# Patient Record
Sex: Male | Born: 1945 | Race: Black or African American | Hispanic: No | Marital: Single | Smoking: Current every day smoker
Health system: Southern US, Community
[De-identification: ages and names within clinical notes are randomized; demographics above are authoritative.]

## PROBLEM LIST (undated history)

## (undated) DIAGNOSIS — J45909 Unspecified asthma, uncomplicated: Secondary | ICD-10-CM

## (undated) DIAGNOSIS — D649 Anemia, unspecified: Secondary | ICD-10-CM

## (undated) DIAGNOSIS — J449 Chronic obstructive pulmonary disease, unspecified: Secondary | ICD-10-CM

## (undated) DIAGNOSIS — C801 Malignant (primary) neoplasm, unspecified: Secondary | ICD-10-CM

---

## 2014-02-22 HISTORY — PX: HIP SURGERY: SHX245

## 2014-07-27 ENCOUNTER — Telehealth: Payer: Self-pay | Admitting: Oncology

## 2014-07-27 NOTE — Telephone Encounter (Signed)
S/W PATIENT SISTER(FLORA) AND GAVE NP APPT FOR 09/16 @ 10:30 W/DR. SHADAD.  CALLED DR. MANI SUBRAMANIAN OFFICE AND LEFT MESSAGE FOR MEDICAL RECORDS TO BE FAXED OVER CONTACT INFORMATION WAS GIVEN.

## 2014-08-05 ENCOUNTER — Encounter (HOSPITAL_COMMUNITY): Payer: Self-pay | Admitting: Emergency Medicine

## 2014-08-05 ENCOUNTER — Emergency Department (HOSPITAL_COMMUNITY)

## 2014-08-05 ENCOUNTER — Inpatient Hospital Stay (HOSPITAL_COMMUNITY)
Admission: EM | Admit: 2014-08-05 | Discharge: 2014-08-07 | DRG: 190 | Disposition: A | Discharge status: Hospice | Attending: Internal Medicine | Admitting: Internal Medicine

## 2014-08-05 DIAGNOSIS — Z79899 Other long term (current) drug therapy: Secondary | ICD-10-CM

## 2014-08-05 DIAGNOSIS — Z515 Encounter for palliative care: Secondary | ICD-10-CM | POA: Diagnosis not present

## 2014-08-05 DIAGNOSIS — R5381 Other malaise: Secondary | ICD-10-CM | POA: Diagnosis present

## 2014-08-05 DIAGNOSIS — Z23 Encounter for immunization: Secondary | ICD-10-CM

## 2014-08-05 DIAGNOSIS — J441 Chronic obstructive pulmonary disease with (acute) exacerbation: Principal | ICD-10-CM | POA: Diagnosis present

## 2014-08-05 DIAGNOSIS — J9620 Acute and chronic respiratory failure, unspecified whether with hypoxia or hypercapnia: Secondary | ICD-10-CM | POA: Diagnosis present

## 2014-08-05 DIAGNOSIS — R1310 Dysphagia, unspecified: Secondary | ICD-10-CM | POA: Diagnosis present

## 2014-08-05 DIAGNOSIS — C7951 Secondary malignant neoplasm of bone: Secondary | ICD-10-CM | POA: Diagnosis present

## 2014-08-05 DIAGNOSIS — IMO0002 Reserved for concepts with insufficient information to code with codable children: Secondary | ICD-10-CM | POA: Diagnosis not present

## 2014-08-05 DIAGNOSIS — R627 Adult failure to thrive: Secondary | ICD-10-CM | POA: Diagnosis present

## 2014-08-05 DIAGNOSIS — Z66 Do not resuscitate: Secondary | ICD-10-CM | POA: Diagnosis present

## 2014-08-05 DIAGNOSIS — D649 Anemia, unspecified: Secondary | ICD-10-CM | POA: Diagnosis present

## 2014-08-05 DIAGNOSIS — F17200 Nicotine dependence, unspecified, uncomplicated: Secondary | ICD-10-CM | POA: Diagnosis present

## 2014-08-05 DIAGNOSIS — F0390 Unspecified dementia without behavioral disturbance: Secondary | ICD-10-CM | POA: Diagnosis present

## 2014-08-05 DIAGNOSIS — E43 Unspecified severe protein-calorie malnutrition: Secondary | ICD-10-CM | POA: Diagnosis present

## 2014-08-05 DIAGNOSIS — R0989 Other specified symptoms and signs involving the circulatory and respiratory systems: Secondary | ICD-10-CM | POA: Diagnosis present

## 2014-08-05 DIAGNOSIS — K922 Gastrointestinal hemorrhage, unspecified: Secondary | ICD-10-CM | POA: Diagnosis present

## 2014-08-05 DIAGNOSIS — G8929 Other chronic pain: Secondary | ICD-10-CM | POA: Diagnosis present

## 2014-08-05 DIAGNOSIS — C61 Malignant neoplasm of prostate: Secondary | ICD-10-CM | POA: Diagnosis present

## 2014-08-05 DIAGNOSIS — C7952 Secondary malignant neoplasm of bone marrow: Secondary | ICD-10-CM

## 2014-08-05 DIAGNOSIS — R531 Weakness: Secondary | ICD-10-CM

## 2014-08-05 DIAGNOSIS — R0603 Acute respiratory distress: Secondary | ICD-10-CM | POA: Diagnosis present

## 2014-08-05 DIAGNOSIS — D62 Acute posthemorrhagic anemia: Secondary | ICD-10-CM | POA: Diagnosis present

## 2014-08-05 DIAGNOSIS — Z9981 Dependence on supplemental oxygen: Secondary | ICD-10-CM

## 2014-08-05 DIAGNOSIS — R0609 Other forms of dyspnea: Secondary | ICD-10-CM | POA: Diagnosis present

## 2014-08-05 DIAGNOSIS — F09 Unspecified mental disorder due to known physiological condition: Secondary | ICD-10-CM | POA: Diagnosis present

## 2014-08-05 HISTORY — DX: Chronic obstructive pulmonary disease, unspecified: J44.9

## 2014-08-05 HISTORY — DX: Unspecified asthma, uncomplicated: J45.909

## 2014-08-05 HISTORY — DX: Malignant (primary) neoplasm, unspecified: C80.1

## 2014-08-05 LAB — TROPONIN I

## 2014-08-05 LAB — URINALYSIS, ROUTINE W REFLEX MICROSCOPIC
GLUCOSE, UA: NEGATIVE mg/dL
Ketones, ur: NEGATIVE mg/dL
Nitrite: NEGATIVE
Protein, ur: 30 mg/dL — AB
SPECIFIC GRAVITY, URINE: 1.025 (ref 1.005–1.030)
Urobilinogen, UA: 1 mg/dL (ref 0.0–1.0)
pH: 5 (ref 5.0–8.0)

## 2014-08-05 LAB — PREPARE RBC (CROSSMATCH)

## 2014-08-05 LAB — URINE MICROSCOPIC-ADD ON

## 2014-08-05 LAB — COMPREHENSIVE METABOLIC PANEL
ALT: 8 U/L (ref 0–53)
AST: 10 U/L (ref 0–37)
Albumin: 2 g/dL — ABNORMAL LOW (ref 3.5–5.2)
Alkaline Phosphatase: 542 U/L — ABNORMAL HIGH (ref 39–117)
Anion gap: 17 — ABNORMAL HIGH (ref 5–15)
BILIRUBIN TOTAL: 0.2 mg/dL — AB (ref 0.3–1.2)
BUN: 22 mg/dL (ref 6–23)
CHLORIDE: 97 meq/L (ref 96–112)
CO2: 23 mEq/L (ref 19–32)
CREATININE: 0.38 mg/dL — AB (ref 0.50–1.35)
Calcium: 7.7 mg/dL — ABNORMAL LOW (ref 8.4–10.5)
GFR calc Af Amer: >90 mL/min (ref >90)
GFR calc non Af Amer: >90 mL/min (ref >90)
Glucose, Bld: 214 mg/dL — ABNORMAL HIGH (ref 70–99)
Potassium: 3.6 mEq/L — ABNORMAL LOW (ref 3.7–5.3)
Sodium: 137 mEq/L (ref 137–147)
TOTAL PROTEIN: 7.2 g/dL (ref 6.0–8.3)

## 2014-08-05 LAB — I-STAT ARTERIAL BLOOD GAS, ED
ACID-BASE EXCESS: 2 mmol/L (ref 0.0–2.0)
Bicarbonate: 27.2 mEq/L — ABNORMAL HIGH (ref 20.0–24.0)
O2 Saturation: 99 %
PO2 ART: 143 mmHg — AB (ref 80.0–100.0)
Patient temperature: 98.6
TCO2: 29 mmol/L (ref 0–100)
pCO2 arterial: 45.4 mmHg — ABNORMAL HIGH (ref 35.0–45.0)
pH, Arterial: 7.386 (ref 7.350–7.450)

## 2014-08-05 LAB — POC OCCULT BLOOD, ED: Fecal Occult Bld: POSITIVE — AB

## 2014-08-05 LAB — CBC
HCT: 23 % — ABNORMAL LOW (ref 39.0–52.0)
Hemoglobin: 7.1 g/dL — ABNORMAL LOW (ref 13.0–17.0)
MCH: 26.6 pg (ref 26.0–34.0)
MCHC: 30.9 g/dL (ref 30.0–36.0)
MCV: 86.1 fL (ref 78.0–100.0)
Platelets: 151 10*3/uL (ref 150–400)
RBC: 2.67 MIL/uL — ABNORMAL LOW (ref 4.22–5.81)
RDW: 22.1 % — AB (ref 11.5–15.5)
WBC: 2.9 10*3/uL — ABNORMAL LOW (ref 4.0–10.5)

## 2014-08-05 LAB — PRO B NATRIURETIC PEPTIDE: Pro B Natriuretic peptide (BNP): 878.3 pg/mL — ABNORMAL HIGH (ref 0–125)

## 2014-08-05 LAB — MRSA PCR SCREENING: MRSA by PCR: NEGATIVE

## 2014-08-05 LAB — ABO/RH: ABO/RH(D): O POS

## 2014-08-05 MED ORDER — HYDROMORPHONE HCL PF 1 MG/ML IJ SOLN
1.0000 mg | Freq: Once | INTRAMUSCULAR | Status: AC
  Administered 2014-08-05: 1 mg via INTRAVENOUS
  Filled 2014-08-05: qty 1

## 2014-08-05 MED ORDER — VITAMIN D3 25 MCG (1000 UNIT) PO TABS
1000.0000 [IU] | ORAL_TABLET | Freq: Every day | ORAL | Status: DC
  Administered 2014-08-05 – 2014-08-07 (×3): 1000 [IU] via ORAL
  Filled 2014-08-05 (×3): qty 1

## 2014-08-05 MED ORDER — IPRATROPIUM BROMIDE 0.02 % IN SOLN
0.5000 mg | Freq: Four times a day (QID) | RESPIRATORY_TRACT | Status: DC
  Administered 2014-08-05 – 2014-08-06 (×2): 0.5 mg via RESPIRATORY_TRACT
  Filled 2014-08-05 (×2): qty 2.5

## 2014-08-05 MED ORDER — ONDANSETRON HCL 4 MG/2ML IJ SOLN: 4.0000 mg | Freq: Four times a day (QID) | INTRAMUSCULAR | Status: DC | PRN

## 2014-08-05 MED ORDER — METHADONE HCL 5 MG PO TABS
15.0000 mg | ORAL_TABLET | Freq: Three times a day (TID) | ORAL | Status: DC
  Administered 2014-08-05 – 2014-08-07 (×6): 15 mg via ORAL
  Filled 2014-08-05: qty 1
  Filled 2014-08-05: qty 2
  Filled 2014-08-05 (×2): qty 1
  Filled 2014-08-05: qty 2
  Filled 2014-08-05 (×5): qty 1

## 2014-08-05 MED ORDER — ALPRAZOLAM 0.5 MG PO TABS
0.5000 mg | ORAL_TABLET | Freq: Three times a day (TID) | ORAL | Status: DC
  Administered 2014-08-05 – 2014-08-07 (×6): 0.5 mg via ORAL
  Filled 2014-08-05 (×6): qty 1

## 2014-08-05 MED ORDER — FUROSEMIDE 10 MG/ML IJ SOLN
40.0000 mg | Freq: Once | INTRAMUSCULAR | Status: AC
  Administered 2014-08-05: 40 mg via INTRAVENOUS
  Filled 2014-08-05: qty 4

## 2014-08-05 MED ORDER — ALBUTEROL SULFATE (2.5 MG/3ML) 0.083% IN NEBU
5.0000 mg | INHALATION_SOLUTION | Freq: Once | RESPIRATORY_TRACT | Status: AC
  Administered 2014-08-05: 5 mg via RESPIRATORY_TRACT
  Filled 2014-08-05: qty 6

## 2014-08-05 MED ORDER — SODIUM CHLORIDE 0.9 % IV SOLN: 10.0000 mL/h | Freq: Once | INTRAVENOUS | Status: DC

## 2014-08-05 MED ORDER — PANTOPRAZOLE SODIUM 40 MG IV SOLR
40.0000 mg | Freq: Two times a day (BID) | INTRAVENOUS | Status: DC
Start: 2014-08-05 — End: 2014-08-07
  Administered 2014-08-05 – 2014-08-07 (×4): 40 mg via INTRAVENOUS
  Filled 2014-08-05 (×6): qty 40

## 2014-08-05 MED ORDER — MORPHINE SULFATE 15 MG PO TABS: 15.0000 mg | ORAL_TABLET | ORAL | Status: DC | PRN

## 2014-08-05 MED ORDER — ALBUTEROL SULFATE (2.5 MG/3ML) 0.083% IN NEBU: 2.5000 mg | INHALATION_SOLUTION | RESPIRATORY_TRACT | Status: DC | PRN

## 2014-08-05 MED ORDER — LEVOFLOXACIN IN D5W 500 MG/100ML IV SOLN
500.0000 mg | INTRAVENOUS | Status: DC
  Administered 2014-08-05 – 2014-08-06 (×2): 500 mg via INTRAVENOUS
  Filled 2014-08-05 (×4): qty 100

## 2014-08-05 MED ORDER — METHYLPREDNISOLONE SODIUM SUCC 125 MG IJ SOLR
60.0000 mg | Freq: Two times a day (BID) | INTRAMUSCULAR | Status: DC
  Administered 2014-08-05 – 2014-08-06 (×2): 60 mg via INTRAVENOUS
  Filled 2014-08-05 (×4): qty 0.96
  Filled 2014-08-05: qty 2

## 2014-08-05 MED ORDER — SENNOSIDES-DOCUSATE SODIUM 8.6-50 MG PO TABS
1.0000 | ORAL_TABLET | Freq: Two times a day (BID) | ORAL | Status: DC
  Administered 2014-08-05 – 2014-08-07 (×4): 1 via ORAL
  Filled 2014-08-05 (×5): qty 1

## 2014-08-05 MED ORDER — ACETAMINOPHEN 650 MG RE SUPP: 650.0000 mg | Freq: Four times a day (QID) | RECTAL | Status: DC | PRN

## 2014-08-05 MED ORDER — ONDANSETRON HCL 4 MG PO TABS: 4.0000 mg | ORAL_TABLET | Freq: Four times a day (QID) | ORAL | Status: DC | PRN

## 2014-08-05 MED ORDER — ALBUTEROL SULFATE (2.5 MG/3ML) 0.083% IN NEBU
2.5000 mg | INHALATION_SOLUTION | Freq: Four times a day (QID) | RESPIRATORY_TRACT | Status: DC
  Administered 2014-08-05 – 2014-08-06 (×2): 2.5 mg via RESPIRATORY_TRACT
  Filled 2014-08-05 (×2): qty 3

## 2014-08-05 MED ORDER — IPRATROPIUM BROMIDE 0.02 % IN SOLN
0.5000 mg | Freq: Once | RESPIRATORY_TRACT | Status: AC
  Administered 2014-08-05: 0.5 mg via RESPIRATORY_TRACT
  Filled 2014-08-05: qty 2.5

## 2014-08-05 MED ORDER — ACETAMINOPHEN 325 MG PO TABS: 650.0000 mg | ORAL_TABLET | Freq: Four times a day (QID) | ORAL | Status: DC | PRN

## 2014-08-05 NOTE — H&P (Addendum)
[facility] History and Physical  Gregory [name] YSA:[id number] DOB: [birth year] DOA: 08/05/2014  Referring physician: EDP PCP: No PCP Per Patient   Chief Complaint: shortness of breath, weakness, failure to thrive  HPI: Gregory [name] is a 68 y.o. 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  No reports of EGD or colonoscopy performed then, but sister reports this being done a few years prior. He was under the care of an oncologist Dr.Mani Gwenevere Ghazi in [place], has received different Chemo Tx in 2014, but none thus year due to low blood counts and poor overall health. He has recently been under Hospice care in [place] and just moved to [place] 1 month back to be with his extended family and now stays with his sister who provides most of the history. Patient is a poor historian and has cognitive dysfunction, which his sister has noticed since he moved in with him Since Monday he has had increasing dyspnea, cough, wheezing, still smokes 1PPD, hence brought to the ER In ER, noted to have Resp distress, anemia with hb of 7, heme positive stool   Review of Systems: positives bolded Constitutional:  No weight loss, night sweats, Fevers, chills, fatigue.  HEENT:  No headaches, Difficulty swallowing,Tooth/dental problems,Sore throat,  No sneezing, itching, ear ache, nasal congestion, post nasal drip,  Cardio-vascular:  chest pain, Orthopnea, PND, swelling in lower extremities, anasarca, dizziness, palpitations  GI:  No heartburn, indigestion, abdominal pain, nausea, vomiting, diarrhea, change in bowel habits, loss of appetite  Resp:  shortness of breath with exertion or at rest. No excess mucus, no productive cough, No non-productive cough, No coughing up of  blood.No change in color of mucus.No wheezing.No chest wall deformity  Skin:  no rash or lesions.  GU:  no dysuria, change in color of urine, no urgency or frequency. No flank pain.  Musculoskeletal:  No joint pain or swelling. No decreased range of motion. No back pain.  Psych:  No change in mood or affect. No depression or anxiety. No memory loss.   Past Medical History  Diagnosis Date  . COPD (chronic obstructive pulmonary disease)   . Asthma   . Cancer     prostate with mets   History reviewed. No pertinent past surgical history. Social History:  reports that he has quit smoking. His smoking use included Cigarettes. He smoked 0.00 packs per day. He does not have any smokeless tobacco history on file. He reports that he does not drink alcohol or use illicit drugs.  No Known Allergies  No family history on file.   Prior to Admission medications   Medication Sig Start Date End Date Taking? Authorizing Provider  ALPRAZolam Duanne Moron) 0.5 MG tablet Take 0.5 mg by mouth 3 (three) times daily.   Yes Historical Provider, MD  cholecalciferol (VITAMIN D) 1000 UNITS tablet Take 1,000 Units by mouth daily.   Yes Historical Provider, MD  dexamethasone (DECADRON) 4 MG tablet Take 4 mg by mouth 2 (two) times daily with a meal.   Yes Historical Provider, MD  hydrochlorothiazide (HYDRODIURIL) 50 MG tablet Take 50 mg by mouth daily.   Yes Historical Provider, MD  methadone (DOLOPHINE) 10 MG tablet Take 15 mg by mouth every 8 (eight) hours.   Yes Historical Provider, MD  morphine (MSIR) 15 MG tablet Take 15 mg by mouth every 3 (three) hours as needed for severe pain.   Yes Historical Provider, MD  pantoprazole (PROTONIX) 40 MG tablet Take 40 mg by mouth 2 (two) times daily.   Yes Historical Provider, MD  predniSONE (DELTASONE) 20 MG tablet Take 10-60 mg by mouth 2 (two) times daily with a meal.    Yes Historical Provider, MD  senna-docusate (SENOKOT-S) 8.6-50 MG per tablet Take 1 tablet by mouth 2  (two) times daily.   Yes Historical Provider, MD   Physical Exam: Filed Vitals:   08/05/14 1315 08/05/14 1330 08/05/14 1346 08/05/14 1433  BP: 163/85 149/62    Pulse: 127 135    Temp:      Resp:      SpO2: 100% 100% 100% 100%    Wt Readings from Last 3 Encounters:  No data found for Wt    General:  Awake, alert, cachectic/emaciated elderly male, wearing BIPAP Eyes: PERRL, normal lids, irises & conjunctiva ENT: oral mucosa dry, grossly normal lips & tongue Neck: no LAD, masses or thyromegaly Cardiovascular: RRR, no m/r/g. No LE edema. Respiratory: scattered wheezes and  ronchi, poor air movement. Abdomen: soft, Nt, ND, BS present Skin: no rash or induration seen on limited exam Musculoskeletal: grossly normal tone BUE/BLE, 2plus ankle edema Psychiatric: unable to assess, on BIPAP Neurologic: grossly non-focal.          Labs on Admission:  Basic Metabolic Panel:  Recent Labs Lab 08/05/14 1239  NA 137  K 3.6*  CL 97  CO2 23  GLUCOSE 214*  BUN 22  CREATININE 0.38*  CALCIUM 7.7*   Liver Function Tests:  Recent Labs Lab 08/05/14 1239  AST 10  ALT 8  ALKPHOS 542*  BILITOT 0.2*  PROT 7.2  ALBUMIN 2.0*   No results found for this basename: LIPASE, AMYLASE,  in the last 168 hours No results found for this basename: AMMONIA,  in the last 168 hours CBC:  Recent Labs Lab 08/05/14 1239  WBC 2.9*  HGB 7.1*  HCT 23.0*  MCV 86.1  PLT 151   Cardiac Enzymes:  Recent Labs Lab 08/05/14 1239  TROPONINI <0.30    BNP (last 3 results)  Recent Labs  08/05/14 1239  PROBNP 878.3*   CBG: No results found for this basename: GLUCAP,  in the last 168 hours  Radiological Exams on Admission: Dg Chest Port 1 View  08/05/2014   CLINICAL DATA:  Shortness of breath.  Trauma.  EXAM: PORTABLE CHEST - 1 VIEW  COMPARISON:  None  FINDINGS: The bones are diffusely sclerotic consistent with metastatic prostate cancer. Port-A-Cath in place. Heart size and vascularity are  normal. The interstitial markings are diffusely slightly accentuated. No effusions or infiltrates.  IMPRESSION: Diffuse sclerotic metastatic disease. Accentuated interstitial markings, nonspecific, without consolidative infiltrates. No prior studies available for comparison.   Electronically Signed   By: Rozetta Nunnery M.D.   On: 08/05/2014 13:15    Assessment/Plan Principal Problem:  Acute Hypoxic resp failure -wean off BIPAP -ABG now -due to COPD with exacerbation -has chronic resp failure uses 3L home O2 -duonebs, IV solumedrol, levaquin  Stage 4 prostate CA/extensive bony mets -previously been Treated with Chemo but recently on Hospice in [place] -had prophylactic Im nail placed in June -scheduled to Fu with Dr.Shadad at [facility] on 9/16 -family understands poor prognoses and if agreeable to consider comfort care if he doesn't improve   GI bleed -intermittent dark stools per sister  and was hospitalized in [place] for same, was given 5units PRBc in July -sister recalls Colonoscopy/endo in 2011 or 12, based on some records at home -IV PPI q12, clears -supportive care for now -if doesn't improve may need to consider further evaluation    Anemia -due to chronic disease/stage 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/ and Gi blood loss -transfuse 2units PRBC -CBC in am   Cognitive dysfunction/early dementia  -noted by sister for >80mth -check TSH,b12   COPD/chronic resp failure -on 3L home O2    Chronic pain -due to extensive bony mets -continue methadone and MSIR   Severe malnutrition -Rd consult, check prealbumin   Asymptomatic pyuria -monitor, no indication for Abx at this time  Code Status: DNR after discussion with sister and daughters DVT Prophylaxis: SCDs Family Communication: d/w sister and daughetrs at bedside Disposition Plan: admit to SDU  Time spent: 12min  Dietrich Ke [facility] Pager [phone]  **Disclaimer: This note may have been dictated with voice recognition software. Similar sounding  words can inadvertently be transcribed and this note may contain transcription errors which may not have been corrected upon publication of note.**

## 2014-08-05 NOTE — ED Notes (Signed)
Family at bedside now states that pt moved here from [place] 1 month prior to seek better health care for terminal prostate CA.  Pt had been improving with increasing appetite.  Sister states that pt has a decreased appetite and appears to have lost 10 lbs in last 3 days.  She also states yesterday he began "talking out of his head".  Pt is with hospice but is wanting to be released in order to seek medical care for his CA.

## 2014-08-05 NOTE — Progress Notes (Signed)
Pt arrived from ED-VSS-started first unit of blood-oriented patient to unit and routine, Call bell within reach. Will continue to monitor. CMT and elink notified of arrival

## 2014-08-05 NOTE — ED Provider Notes (Signed)
CSN: [id number]     Arrival date & time 08/05/14  1152 History   First MD Initiated Contact with Patient 08/05/14 1157     Chief Complaint  Patient presents with  . Shortness of Breath     (Consider location/radiation/quality/duration/timing/severity/associated sxs/prior Treatment) Patient is a 68 y.o. male presenting with shortness of breath. The history is provided by the patient, a relative and the EMS personnel. The history is limited by the condition of the patient.  Shortness of Breath pt w hx copd, smoker, hx metastatic prostate ca, home hospice, presents w generalized weakness, decreased po intake, decreased responsiveness and increased wob/wheezing in the past few days.   Pt moved from [place], [place] to live here w family approximately 1 month ago.  Pt in severe resp distress, tachycardic, on bipap on arrival - level 5 caveat.      Past Medical History  Diagnosis Date  . COPD (chronic obstructive pulmonary disease)   . Asthma   . Cancer     prostate with mets   History reviewed. No pertinent past surgical history. No family history on file. History  Substance Use Topics  . Smoking status: Former Smoker    Types: Cigarettes  . Smokeless tobacco: Not on file  . Alcohol Use: No    Review of Systems  Unable to perform ROS: Severe respiratory distress  Respiratory: Positive for shortness of breath.   level 5 caveat      Allergies  Review of patient's allergies indicates no known allergies.  Home Medications   Prior to Admission medications   Medication Sig Start Date End Date Taking? Authorizing Provider  ALPRAZolam Duanne Moron) 0.5 MG tablet Take 0.5 mg by mouth 3 (three) times daily.   Yes Historical Provider, MD  cholecalciferol (VITAMIN D) 1000 UNITS tablet Take 1,000 Units by mouth daily.   Yes Historical Provider, MD  dexamethasone (DECADRON) 4 MG tablet Take 4 mg by mouth 2 (two) times daily with a meal.   Yes Historical Provider, MD  hydrochlorothiazide  (HYDRODIURIL) 50 MG tablet Take 50 mg by mouth daily.   Yes Historical Provider, MD  methadone (DOLOPHINE) 10 MG tablet Take 15 mg by mouth every 8 (eight) hours.   Yes Historical Provider, MD  morphine (MSIR) 15 MG tablet Take 15 mg by mouth every 3 (three) hours as needed for severe pain.   Yes Historical Provider, MD  pantoprazole (PROTONIX) 40 MG tablet Take 40 mg by mouth 2 (two) times daily.   Yes Historical Provider, MD  predniSONE (DELTASONE) 20 MG tablet Take 10-60 mg by mouth 2 (two) times daily with a meal.    Yes Historical Provider, MD  senna-docusate (SENOKOT-S) 8.6-50 MG per tablet Take 1 tablet by mouth 2 (two) times daily.   Yes Historical Provider, MD   There were no vitals taken for this visit. Physical Exam  Nursing note and vitals reviewed. Constitutional: He appears well-developed and well-nourished.  Very thin appearing, tachycardic, tachypneic, resp distress  HENT:  Head: Atraumatic.  Mouth/Throat: Oropharynx is clear and moist.  Eyes: Conjunctivae are normal. Pupils are equal, round, and reactive to light. No scleral icterus.  Neck: Neck supple. No tracheal deviation present.  Cardiovascular: Regular rhythm, normal heart sounds and intact distal pulses.   tachycardic  Pulmonary/Chest: No accessory muscle usage. No respiratory distress. He has wheezes.  Resp distress, wheezing.   Abdominal: Soft. Bowel sounds are normal. He exhibits no distension and no mass. There is no tenderness. There is no rebound and  no guarding.  Genitourinary:  Very dark brown, almost black, stool, heme positive.   Musculoskeletal: Normal range of motion. He exhibits edema. He exhibits no tenderness.  Symmetric bilateral foot, ankle, and lower leg edema.   Neurological: He is alert.  Alert, oriented to person/place. Moves bil ext purposefully w good strength. sens intact.   Skin: Skin is warm and dry. No rash noted.  Psychiatric:  Alert, resp distress.     ED Course  Procedures  (including critical care time) Labs Review  Results for orders placed during the hospital encounter of 08/05/14  PRO B NATRIURETIC PEPTIDE      Result Value Ref Range   Pro B Natriuretic peptide (BNP) 878.3 (*) 0 - 125 pg/mL  CBC      Result Value Ref Range   WBC 2.9 (*) 4.0 - 10.5 K/uL   RBC 2.67 (*) 4.22 - 5.81 MIL/uL   Hemoglobin 7.1 (*) 13.0 - 17.0 g/dL   HCT 23.0 (*) 39.0 - 52.0 %   MCV 86.1  78.0 - 100.0 fL   MCH 26.6  26.0 - 34.0 pg   MCHC 30.9  30.0 - 36.0 g/dL   RDW 22.1 (*) 11.5 - 15.5 %   Platelets 151  150 - 400 K/uL  COMPREHENSIVE METABOLIC PANEL      Result Value Ref Range   Sodium 137  137 - 147 mEq/L   Potassium 3.6 (*) 3.7 - 5.3 mEq/L   Chloride 97  96 - 112 mEq/L   CO2 23  19 - 32 mEq/L   Glucose, Bld 214 (*) 70 - 99 mg/dL   BUN 22  6 - 23 mg/dL   Creatinine, Ser 0.38 (*) 0.50 - 1.35 mg/dL   Calcium 7.7 (*) 8.4 - 10.5 mg/dL   Total Protein 7.2  6.0 - 8.3 g/dL   Albumin 2.0 (*) 3.5 - 5.2 g/dL   AST 10  0 - 37 U/L   ALT 8  0 - 53 U/L   Alkaline Phosphatase 542 (*) 39 - 117 U/L   Total Bilirubin 0.2 (*) 0.3 - 1.2 mg/dL   GFR calc non Af Amer >90  >90 mL/min   GFR calc Af Amer >90  >90 mL/min   Anion gap 17 (*) 5 - 15  TROPONIN I      Result Value Ref Range   Troponin I <0.30  <0.30 ng/mL  URINALYSIS, ROUTINE W REFLEX MICROSCOPIC      Result Value Ref Range   Color, Urine AMBER (*) YELLOW   APPearance CLOUDY (*) CLEAR   Specific Gravity, Urine 1.025  1.005 - 1.030   pH 5.0  5.0 - 8.0   Glucose, UA NEGATIVE  NEGATIVE mg/dL   Hgb urine dipstick MODERATE (*) NEGATIVE   Bilirubin Urine SMALL (*) NEGATIVE   Ketones, ur NEGATIVE  NEGATIVE mg/dL   Protein, ur 30 (*) NEGATIVE mg/dL   Urobilinogen, UA 1.0  0.0 - 1.0 mg/dL   Nitrite NEGATIVE  NEGATIVE   Leukocytes, UA MODERATE (*) NEGATIVE  URINE MICROSCOPIC-ADD ON      Result Value Ref Range   Squamous Epithelial / LPF RARE  RARE   WBC, UA 11-20  <3 WBC/hpf   RBC / HPF 0-2  <3 RBC/hpf   Bacteria, UA  MANY (*) RARE   Urine-Other MUCOUS PRESENT    I-STAT ARTERIAL BLOOD GAS, ED      Result Value Ref Range   pH, Arterial 7.386  7.350 - 7.450  pCO2 arterial 45.4 (*) 35.0 - 45.0 mmHg   pO2, Arterial 143.0 (*) 80.0 - 100.0 mmHg   Bicarbonate 27.2 (*) 20.0 - 24.0 mEq/L   TCO2 29  0 - 100 mmol/L   O2 Saturation 99.0     Acid-Base Excess 2.0  0.0 - 2.0 mmol/L   Patient temperature 98.6 F     Collection site RADIAL, ALLEN'S TEST ACCEPTABLE     Drawn by Operator     Sample type ARTERIAL    POC OCCULT BLOOD, ED      Result Value Ref Range   Fecal Occult Bld POSITIVE (*) NEGATIVE   Dg Chest Port 1 View  08/05/2014   CLINICAL DATA:  Shortness of breath.  Trauma.  EXAM: PORTABLE CHEST - 1 VIEW  COMPARISON:  None  FINDINGS: The bones are diffusely sclerotic consistent with metastatic prostate cancer. Port-A-Cath in place. Heart size and vascularity are normal. The interstitial markings are diffusely slightly accentuated. No effusions or infiltrates.  IMPRESSION: Diffuse sclerotic metastatic disease. Accentuated interstitial markings, nonspecific, without consolidative infiltrates. No prior studies available for comparison.   Electronically Signed   By: Rozetta Nunnery M.D.   On: 08/05/2014 13:15       EKG Interpretation   Date/Time:  Saturday August 05 2014 11:59:57 EDT Ventricular Rate:  132 PR Interval:    QRS Duration: 63 QT Interval:  303 QTC Calculation: 449 R Axis:   68 Text Interpretation:  Sinus tachycardia Nonspecific ST abnormality  Baseline wander No previous tracing Confirmed by Ashok Cordia  MD, Norwood Quezada ([id number])  on 08/05/2014 12:08:41 PM      MDM  Iv ns. O2. Continuous pulse ox and monitor.  Bipap. Albuterol and atrovent neb.  hgb low.   Patient family/sister arrives. Sister states while in [place] had several transfusions, and at one point hgb 6 - family unsure of specifics, whether from gi bleeding, etc.  She states his stools have been dark/black.  Stool heme pos.  Transfuse  prbcs. States hx copd, uses home o2, also continues to smoke. Also notes hx metastatic prostate ca, hospice care.   Family states compliant w his normal meds, no recent change in meds. No report of fevers at home. No trauma/fall. No specific c/o of pain or other specific symptoms.   Albuterol and atrovent neb tx (has already recd from ems as well as solumedrol iv - pt noted to be on chronic, tapering dose pred at home).  Additional albuterol and atrovent nebs.   Slowly able to wean from bipap.  Stat transfusion 2 units prbc.  Med service called to admit - will admit to stepdown.  CRITICAL CARE  RE copd exacerbation/resp distress requiring bipap, acute gi bleeding w severe anemia, weakness, metastatic cancer.  Performed by: Mirna Mires Total critical care time: 45 Critical care time was exclusive of separately billable procedures and treating other patients. Critical care was necessary to treat or prevent imminent or life-threatening deterioration. Critical care was time spent personally by me on the following activities: development of treatment plan with patient and/or surrogate as well as nursing, discussions with consultants, evaluation of patient's response to treatment, examination of patient, obtaining history from patient or surrogate, ordering and performing treatments and interventions, ordering and review of laboratory studies, ordering and review of radiographic studies, pulse oximetry and re-evaluation of patient's condition.       Mirna Mires, MD 08/05/14 1455

## 2014-08-05 NOTE — Progress Notes (Signed)
Chaplain returned page from nurse about living will. Nurse introduced chaplain to family. Chaplain explained medical POA and living will. Chaplain fielded questions. Family expressed concern about who would make decisions. Chaplain clarified the difference between a living will and a "financial will" stating that "the living will is concerned with the patient's medical wishes should he not be able to make decisions for himself." Chaplain explained that family need not be present for the Advance Directive to be complete. Chaplain demonstrated hospitality by walking family to the exit. Chaplain will refer this case to the unit chaplain during the week.  08/05/14 1800  Clinical Encounter Type  Visited With Family  Visit Type Initial  Referral From Nurse  Stress Factors  Family Stress Factors Health changes;Family relationships;Financial concerns  Regulatory affairs officer (For Healthcare)  Does patient have an advance directive? (Family was interested in Advanced Directive and Living Will)  Gregory [name], Gregory [name] 6:17 PM 08/05/2014

## 2014-08-05 NOTE — ED Notes (Addendum)
Pt from home via EMS for sob.  Hx of copd.  Tripod breathing.  Pt given a total of 15 albuterol and 1 atrovent and 125 solumedrol.  Placed on C-pap.  AO x 4.  Pt is on hospice for prostate CA.  Initially wheezing throughout, but pt has responded to brthng tx with wheezes only to L side.  Denies chest pain.  Ambulatory with walker on scene.

## 2014-08-06 DIAGNOSIS — R0609 Other forms of dyspnea: Secondary | ICD-10-CM

## 2014-08-06 DIAGNOSIS — R0989 Other specified symptoms and signs involving the circulatory and respiratory systems: Secondary | ICD-10-CM

## 2014-08-06 LAB — PREALBUMIN: PREALBUMIN: 7.7 mg/dL — AB (ref 17.0–34.0)

## 2014-08-06 LAB — BASIC METABOLIC PANEL
ANION GAP: 13 (ref 5–15)
BUN: 21 mg/dL (ref 6–23)
CO2: 26 mEq/L (ref 19–32)
CREATININE: 0.43 mg/dL — AB (ref 0.50–1.35)
Calcium: 6.6 mg/dL — ABNORMAL LOW (ref 8.4–10.5)
Chloride: 98 mEq/L (ref 96–112)
Glucose, Bld: 134 mg/dL — ABNORMAL HIGH (ref 70–99)
POTASSIUM: 3.5 meq/L — AB (ref 3.7–5.3)
Sodium: 137 mEq/L (ref 137–147)

## 2014-08-06 LAB — CBC
HCT: 24.1 % — ABNORMAL LOW (ref 39.0–52.0)
Hemoglobin: 7.8 g/dL — ABNORMAL LOW (ref 13.0–17.0)
MCH: 27.3 pg (ref 26.0–34.0)
MCHC: 32.4 g/dL (ref 30.0–36.0)
MCV: 84.3 fL (ref 78.0–100.0)
PLATELETS: 109 10*3/uL — AB (ref 150–400)
RBC: 2.86 MIL/uL — ABNORMAL LOW (ref 4.22–5.81)
RDW: 19.3 % — AB (ref 11.5–15.5)
WBC: 3.3 10*3/uL — ABNORMAL LOW (ref 4.0–10.5)

## 2014-08-06 LAB — IRON AND TIBC
IRON: 55 ug/dL (ref 42–135)
Saturation Ratios: 38 % (ref 20–55)
TIBC: 146 ug/dL — ABNORMAL LOW (ref 215–435)
UIBC: 91 ug/dL — ABNORMAL LOW (ref 125–400)

## 2014-08-06 LAB — TYPE AND SCREEN
ABO/RH(D): O POS
Antibody Screen: NEGATIVE
Unit division: 0
Unit division: 0

## 2014-08-06 LAB — RETICULOCYTES
RBC.: 2.86 MIL/uL — AB (ref 4.22–5.81)
RETIC CT PCT: 0.6 % (ref 0.4–3.1)
Retic Count, Absolute: 17.2 10*3/uL — ABNORMAL LOW (ref 19.0–186.0)

## 2014-08-06 LAB — FERRITIN: FERRITIN: 5003 ng/mL — AB (ref 22–322)

## 2014-08-06 LAB — VITAMIN B12: VITAMIN B 12: 306 pg/mL (ref 211–911)

## 2014-08-06 LAB — FOLATE: Folate: 8.2 ng/mL

## 2014-08-06 MED ORDER — CHLORHEXIDINE GLUCONATE 0.12 % MT SOLN
15.0000 mL | Freq: Two times a day (BID) | OROMUCOSAL | Status: DC
  Administered 2014-08-06 – 2014-08-07 (×2): 15 mL via OROMUCOSAL
  Filled 2014-08-06 (×5): qty 15

## 2014-08-06 MED ORDER — IPRATROPIUM-ALBUTEROL 0.5-2.5 (3) MG/3ML IN SOLN
3.0000 mL | Freq: Four times a day (QID) | RESPIRATORY_TRACT | Status: DC
  Administered 2014-08-06: 3 mL via RESPIRATORY_TRACT

## 2014-08-06 MED ORDER — METHYLPREDNISOLONE SODIUM SUCC 40 MG IJ SOLR
40.0000 mg | Freq: Two times a day (BID) | INTRAMUSCULAR | Status: DC
  Administered 2014-08-06 – 2014-08-07 (×2): 40 mg via INTRAVENOUS
  Filled 2014-08-06 (×4): qty 1

## 2014-08-06 MED ORDER — PNEUMOCOCCAL VAC POLYVALENT 25 MCG/0.5ML IJ INJ
0.5000 mL | INJECTION | INTRAMUSCULAR | Status: AC
  Administered 2014-08-07: 0.5 mL via INTRAMUSCULAR
  Filled 2014-08-06: qty 0.5

## 2014-08-06 MED ORDER — CETYLPYRIDINIUM CHLORIDE 0.05 % MT LIQD
7.0000 mL | Freq: Two times a day (BID) | OROMUCOSAL | Status: DC
  Administered 2014-08-06 – 2014-08-07 (×3): 7 mL via OROMUCOSAL

## 2014-08-06 MED ORDER — IPRATROPIUM-ALBUTEROL 0.5-2.5 (3) MG/3ML IN SOLN
RESPIRATORY_TRACT | Status: AC
  Filled 2014-08-06: qty 3

## 2014-08-06 MED ORDER — IPRATROPIUM-ALBUTEROL 0.5-2.5 (3) MG/3ML IN SOLN
3.0000 mL | Freq: Four times a day (QID) | RESPIRATORY_TRACT | Status: DC
  Administered 2014-08-06 – 2014-08-07 (×3): 3 mL via RESPIRATORY_TRACT
  Filled 2014-08-06 (×3): qty 3

## 2014-08-06 MED ORDER — SODIUM CHLORIDE 0.9 % IJ SOLN
10.0000 mL | INTRAMUSCULAR | Status: DC | PRN
  Administered 2014-08-07: 10 mL

## 2014-08-06 MED ORDER — INFLUENZA VAC SPLIT QUAD 0.5 ML IM SUSY
0.5000 mL | PREFILLED_SYRINGE | INTRAMUSCULAR | Status: AC
  Administered 2014-08-07: 0.5 mL via INTRAMUSCULAR
  Filled 2014-08-06: qty 0.5

## 2014-08-06 NOTE — Progress Notes (Signed)
NURSING PROGRESS NOTE  Gregory [name] [id number] Transfer Data: 08/06/2014 11:46 AM Attending Provider: Reyne Dumas, MD PCP:No PCP Per Patient Code Status: DNR  Gregory [name] is a 68 y.o. male patient transferred from 3S  -No acute distress noted.  -No complaints of shortness of breath.  -No complaints of chest pain.   Cardiac Monitoring: n/a  Blood pressure 116/60, pulse 84, temperature 97.9 F (36.6 C), temperature source Oral, resp. rate 14, height 5\' 10"  (1.778 m), weight 62.1 kg (136 lb 14.5 oz), SpO2 98.00%.   IV Fluids:  IV in place, occlusive dsg intact without redness  Allergies:  Review of patient's allergies indicates no known allergies.  Past Medical History:   has a past medical history of COPD (chronic obstructive pulmonary disease); Asthma; and Cancer.  Past Surgical History:   has no past surgical history on file.  Social History:   reports that he has quit smoking. His smoking use included Cigarettes. He smoked 0.00 packs per day. He does not have any smokeless tobacco history on file. He reports that he does not drink alcohol or use illicit drugs.  Skin: intact  Patient/Family orientated to room. Information packet given to patient/family. Admission inpatient armband information verified with patient/family to include name and date of birth and placed on patient arm. Side rails up x 2, fall assessment and education completed with patient/family. Patient/family able to verbalize understanding of risk associated with falls to call for assistance before getting out of bed. Call light within reach.   Will continue to evaluate and treat per MD orders.

## 2014-08-06 NOTE — Progress Notes (Signed)
IP visit Gregory [name] and Palliative Care of [name] RN  Related Admission to [facility] daignosis of CA Prostate with mets. Patient is a FULL CODE. Patient was easily arousable on visit. patient stated that he had alot of anxiety about his shortness of breath and thats why he came to the ED. patient states his goal is to walk without the walker. Patient does not understand why he can not walk with out the walker physically. Patient has no acute distress on visit, lungs sounded rhonchi and wheezing. No family at bedside.  Please call with any hospice concerns [phone] [facility] Thank you [name] RN

## 2014-08-06 NOTE — Progress Notes (Signed)
[facility] PROGRESS NOTE  Gregory [name] RCV:[id number] DOB: [birth year] DOA: 08/05/2014 PCP: No PCP Per Patient  Assessment/Plan: Principal Problem:   COPD with exacerbation Active Problems:   Respiratory distress   Prostate cancer metastatic to bone   Anemia   GI bleed   Cognitive dysfunction   COPD exacerbation    Acute Hypoxic resp failure  Weaned  off BIPAP , currently on nasal cannula -due to COPD with exacerbation  -has chronic resp failure uses 3L home O2  -duonebs, IV solumedrol, taper steroids, levaquin  Stage 4 prostate CA/extensive bony mets  -previously been Treated with Chemo but recently on Hospice in [place]  -had prophylactic Im nail placed in June  -scheduled to Fu with Gregory [name] at [facility] on 9/16  -family understands poor prognoses , will request a palliative care consult to define hospice illegibility, goals of care  GI bleed  -intermittent dark stools per sister and was hospitalized in [place] for same, was given 5units PRBc in July  -sister recalls Colonoscopy/endo in 2011 or 12,  H&H stable -IV PPI q12, currently on clear liquids Will advance diet as tolerated based on speech therapy evaluation Guaiac-positive, status post transfusion of 2 units Probably not a candidate for another GI workup Would not be able to tolerate because of his debilitated condition  Anemia  -due to chronic disease/stage 4CA/ and Gi blood loss  -transfuse 2units PRBC  -CBC in am   Cognitive dysfunction/early dementia  -noted by sister for >93mth  -check TSH,b12 pending   COPD/chronic resp failure  -on 3L home O2 ,  Chronic pain  -due to extensive bony mets  -continue methadone and MSIR  Severe malnutrition  -Rd consult, check prealbumin  Asymptomatic pyuria  -monitor, no indication for Abx at this time   Code Status: full Family Communication: family updated about patient's clinical progress Disposition Plan:  Transfer to telemetry   Brief narrative: Gregory [name] is a 68 y.o. male with PMH of Terminal Prostate Cancer with extensive bony mets, COPD on 3L home O2, ongoing tobacco use 1PPD, H/o prophylactic Hip pinning, h/o severe anemia, was hospitalized in July at [facility] in [place], [place] with GI bleed and anemia was transfused 5units PRBC at the time.  No reports of EGD or colonoscopy performed then, but sister reports this being done a few years prior.  He was under the care of an oncologist Dr.Mani Gwenevere [name] in [place], has received different Chemo [place] in 2014, but none thus year due to low blood counts and poor overall health.  He has recently been under Hospice care in [place] and just moved to [place] 1 month back to be with his extended family and now stays with his sister who provides most of the history.  Patient is a poor historian and has cognitive dysfunction, which his sister has noticed since he moved in with him  Since Monday he has had increasing dyspnea, cough, wheezing, still smokes 1PPD, hence brought to the ER  In ER, noted to have Resp distress, anemia with hb of 7, heme positive stool      Consultants:  None  Procedures:  None  Antibiotics:  Levofloxacin-9/12  HPI/Subjective: Denies any chest pain or shortness of breath, alert oriented, comfortable, asking appropriate questions  Objective: Filed Vitals:   08/06/14 0105 08/06/14 0400 08/06/14 0739 08/06/14 0812  BP: 120/58 125/68 116/64   Pulse: 94 88 83   Temp: 98.9 F (37.2 C) 98.2 F (36.8 C) 98.9 F (37.2 C)  TempSrc: Oral Oral Oral   Resp: 15 15 10    Height:      Weight:      SpO2: 99% 96%  97%    Intake/Output Summary (Last 24 hours) at 08/06/14 1026 Last data filed at 08/06/14 0401  Gross per 24 hour  Intake    770 ml  Output   1125 ml  Net   -355 ml    Exam:  General: alert & oriented x 3 In NAD  Cardiovascular: RRR, nl S1 s2  Respiratory: Decreased breath sounds at the bases, scattered rhonchi, no crackles  Abdomen: soft +BS NT/ND, no  masses palpable  Extremities: No cyanosis and no edema      Data Reviewed: Basic Metabolic Panel:  Recent Labs Lab 08/05/14 1239 08/06/14 0505  NA 137 137  K 3.6* 3.5*  CL 97 98  CO2 23 26  GLUCOSE 214* 134*  BUN 22 21  CREATININE 0.38* 0.43*  CALCIUM 7.7* 6.6*    Liver Function Tests:  Recent Labs Lab 08/05/14 1239  AST 10  ALT 8  ALKPHOS 542*  BILITOT 0.2*  PROT 7.2  ALBUMIN 2.0*   No results found for this basename: LIPASE, AMYLASE,  in the last 168 hours No results found for this basename: AMMONIA,  in the last 168 hours  CBC:  Recent Labs Lab 08/05/14 1239 08/06/14 0505  WBC 2.9* 3.3*  HGB 7.1* 7.8*  HCT 23.0* 24.1*  MCV 86.1 84.3  PLT 151 109*    Cardiac Enzymes:  Recent Labs Lab 08/05/14 1239  TROPONINI <0.30   BNP (last 3 results)  Recent Labs  08/05/14 1239  PROBNP 878.3*     CBG: No results found for this basename: GLUCAP,  in the last 168 hours  Recent Results (from the past 240 hour(s))  MRSA PCR SCREENING     Status: None   Collection Time    08/05/14  5:21 PM      Result Value Ref Range Status   MRSA by PCR NEGATIVE  NEGATIVE Final   Comment:            The GeneXpert MRSA Assay (FDA     approved for NASAL specimens     only), is one component of a     comprehensive MRSA colonization     surveillance program. It is not     intended to diagnose MRSA     infection nor to guide or     monitor treatment for     MRSA infections.     Studies: Dg Chest Port 1 View  08/05/2014   CLINICAL DATA:  Shortness of breath.  Trauma.  EXAM: PORTABLE CHEST - 1 VIEW  COMPARISON:  None  FINDINGS: The bones are diffusely sclerotic consistent with metastatic prostate cancer. Port-A-Cath in place. Heart size and vascularity are normal. The interstitial markings are diffusely slightly accentuated. No effusions or infiltrates.  IMPRESSION: Diffuse sclerotic metastatic disease. Accentuated interstitial markings, nonspecific, without  consolidative infiltrates. No prior studies available for comparison.   Electronically Signed   By: Rozetta Nunnery M.D.   On: 08/05/2014 13:15    Scheduled Meds: . albuterol  2.5 mg Nebulization QID  . ALPRAZolam  0.5 mg Oral TID  . antiseptic oral rinse  7 mL Mouth Rinse q12n4p  . chlorhexidine  15 mL Mouth Rinse BID  . cholecalciferol  1,000 Units Oral Daily  . ipratropium  0.5 mg Nebulization QID  . levofloxacin (LEVAQUIN) IV  500 mg Intravenous  Q24H  . methadone  15 mg Oral 3 times per day  . methylPREDNISolone (SOLU-MEDROL) injection  60 mg Intravenous Q12H  . pantoprazole (PROTONIX) IV  40 mg Intravenous Q12H  . senna-docusate  1 tablet Oral BID   Continuous Infusions:   Principal Problem:   COPD with exacerbation Active Problems:   Respiratory distress   Prostate cancer metastatic to bone   Anemia   GI bleed   Cognitive dysfunction   COPD exacerbation    Time spent: 40 minutes   [facility] Pager [phone]. If 7PM-7AM, please contact night-coverage at [email or web address], password [facility] 08/06/2014, 10:26 AM  LOS: 1 day

## 2014-08-06 NOTE — Progress Notes (Signed)
Report received from [place], [place] for transfer to [phone]

## 2014-08-06 NOTE — Progress Notes (Signed)
Utilization review completed.  

## 2014-08-07 LAB — COMPREHENSIVE METABOLIC PANEL
ALK PHOS: 426 U/L — AB (ref 39–117)
ALT: <5 U/L (ref 0–53)
ANION GAP: 13 (ref 5–15)
AST: 7 U/L (ref 0–37)
Albumin: 1.7 g/dL — ABNORMAL LOW (ref 3.5–5.2)
BILIRUBIN TOTAL: 0.2 mg/dL — AB (ref 0.3–1.2)
BUN: 17 mg/dL (ref 6–23)
CHLORIDE: 98 meq/L (ref 96–112)
CO2: 27 meq/L (ref 19–32)
Calcium: 6.7 mg/dL — ABNORMAL LOW (ref 8.4–10.5)
Creatinine, Ser: 0.38 mg/dL — ABNORMAL LOW (ref 0.50–1.35)
GFR calc Af Amer: >90 mL/min (ref >90)
GLUCOSE: 137 mg/dL — AB (ref 70–99)
POTASSIUM: 3.5 meq/L — AB (ref 3.7–5.3)
Sodium: 138 mEq/L (ref 137–147)
Total Protein: 6 g/dL (ref 6.0–8.3)

## 2014-08-07 LAB — CBC
HEMATOCRIT: 25.3 % — AB (ref 39.0–52.0)
HEMOGLOBIN: 8 g/dL — AB (ref 13.0–17.0)
MCH: 26.8 pg (ref 26.0–34.0)
MCHC: 31.6 g/dL (ref 30.0–36.0)
MCV: 84.6 fL (ref 78.0–100.0)
Platelets: 111 10*3/uL — ABNORMAL LOW (ref 150–400)
RBC: 2.99 MIL/uL — AB (ref 4.22–5.81)
RDW: 19.6 % — AB (ref 11.5–15.5)
WBC: 4.6 10*3/uL (ref 4.0–10.5)

## 2014-08-07 LAB — TSH: TSH: 0.46 u[IU]/mL (ref 0.350–4.500)

## 2014-08-07 MED ORDER — LEVOFLOXACIN 500 MG PO TABS: 500.0000 mg | ORAL_TABLET | Freq: Every day | ORAL | Status: AC

## 2014-08-07 MED ORDER — IPRATROPIUM-ALBUTEROL 0.5-2.5 (3) MG/3ML IN SOLN: 3.0000 mL | Freq: Four times a day (QID) | RESPIRATORY_TRACT | Status: AC

## 2014-08-07 NOTE — Progress Notes (Signed)
Pt is a hospice home care pt.  Pt lives with his sister and her husband.  Pt was living in [place] up until about 2 months ago when his sister brought him to her home.  Code status has been discussed on several occasions with pt and he remains adamant that he wants to be a full code.  Pt has been talking about contacting the [facility] treatment center to discuss a possible consult.  Pt is aware that he would need to revoke hospice if he decides to do this.  [place], [place] ext: [phone]

## 2014-08-07 NOTE — Care Management Note (Signed)
    Page 1 of 1   08/07/2014     4:55:48 PM CARE MANAGEMENT NOTE 08/07/2014  Patient:  Gregory [name], Gregory [name]   Account Number:  [id number]  Date Initiated:  08/07/2014  Documentation initiated by:  Tomi Bamberger  Subjective/Objective Assessment:   dx resp distress  admit- lives with sister.  Active with [facility].     Action/Plan:   Anticipated DC Date:  08/07/2014   Anticipated DC Plan:  HOME W HOSPICE CARE  In-house referral  Clinical Social Worker      DC Planning Services  CM consult      PAC Choice  HOSPICE  Resumption Of Svcs/PTA Provider   Choice offered to / List presented to:  C-5 Sibling           Status of service:  Completed, signed off Medicare Important Message given?  YES (If response is "NO", the following Medicare IM given date fields will be blank) Date Medicare IM given:  08/07/2014 Medicare IM given by:  Tomi Bamberger Date Additional Medicare IM given:   Additional Medicare IM given by:    Discharge Disposition:  [place]  Per UR Regulation:  Reviewed for med. necessity/level of care/duration of stay  If discussed at [place] of Stay Meetings, dates discussed:    Comments:  08/07/14 [place], BSN [phone] patient lives with sister, patient is active with [facility], NCM spoke with sister and they would like to continue with [facility].  Santiago Glad with [facility] is here and she states they did not drop patient he is active with them.  Notified her that he is for dc today via ambulance.  CSW aware of ambulance transport.

## 2014-08-07 NOTE — Progress Notes (Signed)
OT Cancellation Note  Patient Details Name: Teigen Bellin MRN: [id number] DOB: [birth year]   Cancelled Treatment:    Reason Eval/Treat Not Completed: OT screened, no needs identified, will sign off Sister assists with care at home. No new needs.  [place], OTR/L  [phone] 08/07/2014 08/07/2014, 3:51 PM

## 2014-08-07 NOTE — Progress Notes (Signed)
Inpatient RN visit- Gregory [name] [facility] 5W Room 36-[facility] RN Visit-Gregory Beckstrand RN  Related admission to Gregory [name] diagnosis of Prostate Ca.  Pt is Full code.   Pt seen at bedside, sitting up in recliner, alert and interactive. Pt denied pain or any needs. Loose nonproductive cough noted during visit. Pt with some dyspnea with talking. Oxygen in place via [place] at 2 L. Per chart review and staff RN Gregory [name], pt is to be discharged back home via EMS this afternoon. Writer spoke with pt's sister Gregory [name] in regards to questions she had about medication changes and new meds, questions answered. Gregory [name] is aware that her brother will be returning home.  Pt to discharge on oral abt and indwelling foley in place. [facility] team aware of discharge. [name] home medication list is on shadow chart.   Please call [facility] @ [phone]-with any hospice needs.   Thank you. Gregory Harries, RN  [name]  Hospice Liaison  [phone])

## 2014-08-07 NOTE — Discharge Summary (Signed)
Physician Discharge Summary  Orvis Stann MRN: [id number] DOB/AGE: 03-22-1946 68 y.o.  PCP: No PCP Per Patient   Admit date: 08/05/2014 Discharge date: 08/07/2014  Discharge Diagnoses:  Full code on home hospice   COPD with exacerbation Dysphagia   Respiratory distress   Prostate cancer metastatic to bone   Anemia   GI bleed   Cognitive dysfunction   COPD exacerbation   Followup recommendations Follow up with PCP in 5-7 days Follow up CBC weekly Dysphagia 3 (Mechanical Soft);Thin liquid      Medication List    STOP taking these medications       hydrochlorothiazide 50 MG tablet  Commonly known as:  HYDRODIURIL      TAKE these medications       ALPRAZolam 0.5 MG tablet  Commonly known as:  XANAX  Take 0.5 mg by mouth 3 (three) times daily.     cholecalciferol 1000 UNITS tablet  Commonly known as:  VITAMIN D  Take 1,000 Units by mouth daily.     dexamethasone 4 MG tablet  Commonly known as:  DECADRON  Take 4 mg by mouth 2 (two) times daily with a meal.     ipratropium-albuterol 0.5-2.5 (3) MG/3ML Soln  Commonly known as:  DUONEB  Take 3 mLs by nebulization every 6 (six) hours.     levofloxacin 500 MG tablet  Commonly known as:  LEVAQUIN  Take 1 tablet (500 mg total) by mouth daily.     methadone 10 MG tablet  Commonly known as:  DOLOPHINE  Take 15 mg by mouth every 8 (eight) hours.     morphine 15 MG tablet  Commonly known as:  MSIR  Take 15 mg by mouth every 3 (three) hours as needed for severe pain.     pantoprazole 40 MG tablet  Commonly known as:  PROTONIX  Take 40 mg by mouth 2 (two) times daily.     predniSONE 20 MG tablet  Commonly known as:  DELTASONE  Take 10-60 mg by mouth 2 (two) times daily with a meal.     senna-docusate 8.6-50 MG per tablet  Commonly known as:  Senokot-S  Take 1 tablet by mouth 2 (two) times daily.        Discharge Condition: Stable currently  Disposition: Final discharge disposition not  confirmed   Consults: None  Significant Diagnostic Studies: Dg Chest Port 1 View  08/05/2014   CLINICAL DATA:  Shortness of breath.  Trauma.  EXAM: PORTABLE CHEST - 1 VIEW  COMPARISON:  None  FINDINGS: The bones are diffusely sclerotic consistent with metastatic prostate cancer. Port-A-Cath in place. Heart size and vascularity are normal. The interstitial markings are diffusely slightly accentuated. No effusions or infiltrates.  IMPRESSION: Diffuse sclerotic metastatic disease. Accentuated interstitial markings, nonspecific, without consolidative infiltrates. No prior studies available for comparison.   Electronically Signed   By: Rozetta Nunnery M.D.   On: 08/05/2014 13:15      Microbiology: Recent Results (from the past 240 hour(s))  MRSA PCR SCREENING     Status: None   Collection Time    08/05/14  5:21 PM      Result Value Ref Range Status   MRSA by PCR NEGATIVE  NEGATIVE Final   Comment:            The GeneXpert MRSA Assay (FDA     approved for NASAL specimens     only), is one component of a     comprehensive MRSA colonization  surveillance program. It is not     intended to diagnose MRSA     infection nor to guide or     monitor treatment for     MRSA infections.     Labs: Results for orders placed during the hospital encounter of 08/05/14 (from the past 48 hour(s))  I-STAT ARTERIAL BLOOD GAS, ED     Status: Abnormal   Collection Time    08/05/14 12:22 PM      Result Value Ref Range   pH, Arterial 7.386  7.350 - 7.450   pCO2 arterial 45.4 (*) 35.0 - 45.0 mmHg   pO2, Arterial 143.0 (*) 80.0 - 100.0 mmHg   Bicarbonate 27.2 (*) 20.0 - 24.0 mEq/L   TCO2 29  0 - 100 mmol/L   O2 Saturation 99.0     Acid-Base Excess 2.0  0.0 - 2.0 mmol/L   Patient temperature 98.6 F     Collection site RADIAL, ALLEN'S TEST ACCEPTABLE     Drawn by Operator     Sample type ARTERIAL    PRO B NATRIURETIC PEPTIDE     Status: Abnormal   Collection Time    08/05/14 12:39 PM      Result  Value Ref Range   Pro B Natriuretic peptide (BNP) 878.3 (*) 0 - 125 pg/mL  CBC     Status: Abnormal   Collection Time    08/05/14 12:39 PM      Result Value Ref Range   WBC 2.9 (*) 4.0 - 10.5 K/uL   RBC 2.67 (*) 4.22 - 5.81 MIL/uL   Hemoglobin 7.1 (*) 13.0 - 17.0 g/dL   HCT 23.0 (*) 39.0 - 52.0 %   MCV 86.1  78.0 - 100.0 fL   MCH 26.6  26.0 - 34.0 pg   MCHC 30.9  30.0 - 36.0 g/dL   RDW 22.1 (*) 11.5 - 15.5 %   Platelets 151  150 - 400 K/uL  COMPREHENSIVE METABOLIC PANEL     Status: Abnormal   Collection Time    08/05/14 12:39 PM      Result Value Ref Range   Sodium 137  137 - 147 mEq/L   Potassium 3.6 (*) 3.7 - 5.3 mEq/L   Chloride 97  96 - 112 mEq/L   CO2 23  19 - 32 mEq/L   Glucose, Bld 214 (*) 70 - 99 mg/dL   BUN 22  6 - 23 mg/dL   Creatinine, Ser 0.38 (*) 0.50 - 1.35 mg/dL   Calcium 7.7 (*) 8.4 - 10.5 mg/dL   Total Protein 7.2  6.0 - 8.3 g/dL   Albumin 2.0 (*) 3.5 - 5.2 g/dL   AST 10  0 - 37 U/L   ALT 8  0 - 53 U/L   Alkaline Phosphatase 542 (*) 39 - 117 U/L   Total Bilirubin 0.2 (*) 0.3 - 1.2 mg/dL   GFR calc non Af Amer >90  >90 mL/min   GFR calc Af Amer >90  >90 mL/min   Comment: (NOTE)     The eGFR has been calculated using the CKD EPI equation.     This calculation has not been validated in all clinical situations.     eGFR's persistently <90 mL/min signify possible Chronic Kidney     Disease.   Anion gap 17 (*) 5 - 15  TROPONIN I     Status: None   Collection Time    08/05/14 12:39 PM      Result Value Ref  Range   Troponin I <0.30  <0.30 ng/mL   Comment:            Due to the release kinetics of cTnI,     a negative result within the first hours     of the onset of symptoms does not rule out     myocardial infarction with certainty.     If myocardial infarction is still suspected,     repeat the test at appropriate intervals.  POC OCCULT BLOOD, ED     Status: Abnormal   Collection Time    08/05/14  1:16 PM      Result Value Ref Range   Fecal Occult  Bld POSITIVE (*) NEGATIVE  TYPE AND SCREEN     Status: None   Collection Time    08/05/14  1:35 PM      Result Value Ref Range   ABO/RH(D) O POS     Antibody Screen NEG     Sample Expiration 08/08/2014     Unit Number [id number]     Blood Component Type RED CELLS,LR     Unit division 00     Status of Unit ISSUED,FINAL     Transfusion Status OK TO TRANSFUSE     Crossmatch Result Compatible     Unit Number [id number]     Blood Component Type RED CELLS,LR     Unit division 00     Status of Unit ISSUED,FINAL     Transfusion Status OK TO TRANSFUSE     Crossmatch Result Compatible    PREPARE RBC (CROSSMATCH)     Status: None   Collection Time    08/05/14  1:35 PM      Result Value Ref Range   Order Confirmation ORDER PROCESSED BY BLOOD BANK    ABO/RH     Status: None   Collection Time    08/05/14  1:35 PM      Result Value Ref Range   ABO/RH(D) O POS    URINALYSIS, ROUTINE W REFLEX MICROSCOPIC     Status: Abnormal   Collection Time    08/05/14  2:14 PM      Result Value Ref Range   Color, Urine AMBER (*) YELLOW   Comment: BIOCHEMICALS MAY BE AFFECTED BY COLOR   APPearance CLOUDY (*) CLEAR   Specific Gravity, Urine 1.025  1.005 - 1.030   pH 5.0  5.0 - 8.0   Glucose, UA NEGATIVE  NEGATIVE mg/dL   Hgb urine dipstick MODERATE (*) NEGATIVE   Bilirubin Urine SMALL (*) NEGATIVE   Ketones, ur NEGATIVE  NEGATIVE mg/dL   Protein, ur 30 (*) NEGATIVE mg/dL   Urobilinogen, UA 1.0  0.0 - 1.0 mg/dL   Nitrite NEGATIVE  NEGATIVE   Leukocytes, UA MODERATE (*) NEGATIVE  URINE MICROSCOPIC-ADD ON     Status: Abnormal   Collection Time    08/05/14  2:14 PM      Result Value Ref Range   Squamous Epithelial / LPF RARE  RARE   WBC, UA 11-20  <3 WBC/hpf   RBC / HPF 0-2  <3 RBC/hpf   Bacteria, UA MANY (*) RARE   Urine-Other MUCOUS PRESENT    MRSA PCR SCREENING     Status: None   Collection Time    08/05/14  5:21 PM      Result Value Ref Range   MRSA by PCR NEGATIVE  NEGATIVE    Comment:  The GeneXpert MRSA Assay (FDA     approved for NASAL specimens     only), is one component of a     comprehensive MRSA colonization     surveillance program. It is not     intended to diagnose MRSA     infection nor to guide or     monitor treatment for     MRSA infections.  CBC     Status: Abnormal   Collection Time    08/06/14  5:05 AM      Result Value Ref Range   WBC 3.3 (*) 4.0 - 10.5 K/uL   RBC 2.86 (*) 4.22 - 5.81 MIL/uL   Hemoglobin 7.8 (*) 13.0 - 17.0 g/dL   HCT 24.1 (*) 39.0 - 52.0 %   MCV 84.3  78.0 - 100.0 fL   MCH 27.3  26.0 - 34.0 pg   MCHC 32.4  30.0 - 36.0 g/dL   RDW 19.3 (*) 11.5 - 15.5 %   Platelets 109 (*) 150 - 400 K/uL   Comment: REPEATED TO VERIFY     PLATELET COUNT CONFIRMED BY SMEAR  BASIC METABOLIC PANEL     Status: Abnormal   Collection Time    08/06/14  5:05 AM      Result Value Ref Range   Sodium 137  137 - 147 mEq/L   Potassium 3.5 (*) 3.7 - 5.3 mEq/L   Chloride 98  96 - 112 mEq/L   CO2 26  19 - 32 mEq/L   Glucose, Bld 134 (*) 70 - 99 mg/dL   BUN 21  6 - 23 mg/dL   Creatinine, Ser 0.43 (*) 0.50 - 1.35 mg/dL   Calcium 6.6 (*) 8.4 - 10.5 mg/dL   GFR calc non Af Amer >90  >90 mL/min   GFR calc Af Amer >90  >90 mL/min   Comment: (NOTE)     The eGFR has been calculated using the CKD EPI equation.     This calculation has not been validated in all clinical situations.     eGFR's persistently <90 mL/min signify possible Chronic Kidney     Disease.   Anion gap 13  5 - 15  VITAMIN B12     Status: None   Collection Time    08/06/14  5:05 AM      Result Value Ref Range   Vitamin B-12 306  211 - 911 pg/mL   Comment: Performed at [facility]     Status: Abnormal   Collection Time    08/06/14  5:05 AM      Result Value Ref Range   Iron 55  42 - 135 ug/dL   TIBC 146 (*) 215 - 435 ug/dL   Saturation Ratios 38  20 - 55 %   UIBC 91 (*) 125 - 400 ug/dL   Comment: Performed at [facility]     Status: Abnormal   Collection Time    08/06/14  5:05 AM      Result Value Ref Range   Ferritin 5003 (*) 22 - 322 ng/mL   Comment: Result confirmed by automatic dilution.     Performed at [facility]     Status: None   Collection Time    08/06/14  5:05 AM      Result Value Ref Range   Folate 8.2     Comment: (NOTE)     Reference Ranges  Deficient:       0.4 - 3.3 ng/mL            Indeterminate:   3.4 - 5.4 ng/mL            Normal:              > 5.4 ng/mL     Performed at [facility]     Status: Abnormal   Collection Time    08/06/14  5:05 AM      Result Value Ref Range   Prealbumin 7.7 (*) 17.0 - 34.0 mg/dL   Comment: Performed at [facility]     Status: Abnormal   Collection Time    08/06/14  5:05 AM      Result Value Ref Range   Retic Ct Pct 0.6  0.4 - 3.1 %   RBC. 2.86 (*) 4.22 - 5.81 MIL/uL   Retic Count, Manual 17.2 (*) 19.0 - 186.0 K/uL  CBC     Status: Abnormal   Collection Time    08/07/14  4:54 AM      Result Value Ref Range   WBC 4.6  4.0 - 10.5 K/uL   RBC 2.99 (*) 4.22 - 5.81 MIL/uL   Hemoglobin 8.0 (*) 13.0 - 17.0 g/dL   HCT 25.3 (*) 39.0 - 52.0 %   MCV 84.6  78.0 - 100.0 fL   MCH 26.8  26.0 - 34.0 pg   MCHC 31.6  30.0 - 36.0 g/dL   RDW 19.6 (*) 11.5 - 15.5 %   Platelets 111 (*) 150 - 400 K/uL   Comment: CONSISTENT WITH PREVIOUS RESULT  COMPREHENSIVE METABOLIC PANEL     Status: Abnormal   Collection Time    08/07/14  4:54 AM      Result Value Ref Range   Sodium 138  137 - 147 mEq/L   Potassium 3.5 (*) 3.7 - 5.3 mEq/L   Chloride 98  96 - 112 mEq/L   CO2 27  19 - 32 mEq/L   Glucose, Bld 137 (*) 70 - 99 mg/dL   BUN 17  6 - 23 mg/dL   Creatinine, Ser 0.38 (*) 0.50 - 1.35 mg/dL   Calcium 6.7 (*) 8.4 - 10.5 mg/dL   Total Protein 6.0  6.0 - 8.3 g/dL   Albumin 1.7 (*) 3.5 - 5.2 g/dL   AST 7  0 - 37 U/L   ALT <5  0 - 53 U/L   Alkaline Phosphatase 426 (*) 39 - 117 U/L    Total Bilirubin 0.2 (*) 0.3 - 1.2 mg/dL   GFR calc non Af Amer >90  >90 mL/min   GFR calc Af Amer >90  >90 mL/min   Comment: (NOTE)     The eGFR has been calculated using the CKD EPI equation.     This calculation has not been validated in all clinical situations.     eGFR's persistently <90 mL/min signify possible Chronic Kidney     Disease.   Anion gap 13  5 - 15     HPI : Gregory [name] is a 68 y.o. 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  No reports of EGD or colonoscopy performed then, but sister reports  this being done a few years prior.  He was under the care of an oncologist Dr.Mani Gwenevere Ghazi in [place], has received different Chemo Tx in 2014, but none thus year due to low blood counts and poor overall health.  He has recently been under Hospice care in [place] and just moved to [place] 1 month back to be with his extended family and now stays with his sister who provides most of the history.  Patient is a poor historian and has cognitive dysfunction, which his sister has noticed since he moved in with him  Since Monday he has had increasing dyspnea, cough, wheezing, still smokes 1PPD, hence brought to the ER  In ER, noted to have Resp distress, anemia with hb of 7, heme positive stool      HOSPITAL COURSE:  Acute Hypoxic resp failure  Initial chest x-ray showed Diffuse sclerotic metastatic disease. Accentuated interstitial  markings, nonspecific, without consolidative infiltrates Weaned off BIPAP , currently on nasal cannula  -due to COPD with exacerbation , no obvious pneumonia -has chronic resp failure uses 3L home O2  -Started on duonebs, IV solumedrol,, IV levaquin  Switched to by mouth Levaquin for another 4 days, on prednisone and  Decadron at home   Stage 4 prostate CA/extensive bony mets  -previously been Treated with Chemo but recently on Hospice in [place]  -had prophylactic Im nail placed in June  -scheduled to Fu with Dr.Shadad at [facility] on 9/16  -family understands poor prognoses ,  Patient wants to be a full code Currently under hospice care  GI bleed-acute blood loss anemia  -intermittent dark stools per sister and was hospitalized in [place] for same, was given 5units PRBc in July  -sister recalls Colonoscopy/endo in 2011 or 12,  H&H stable  Switch to by mouth Protonix twice a day SLP evaluation completed, recommends dysphagia 3 diet with thin liquids Guaiac-positive, status post transfusion of 2 units  Probably not a candidate for another GI workup  Would not be able to tolerate because of his debilitated condition  Recommend CBC weekly  Cognitive dysfunction/early dementia  -noted by sister for >53mh  TSH pending, vitamin B 12 is 306   COPD/chronic resp failure  -on 3L home O2 ,   Chronic pain  -due to extensive bony mets  -continue methadone and MSIR   Severe malnutrition  Prealbumin 7.7   Asymptomatic pyuria  -monitor, no indication for Abx at this time        Discharge Exam:   Blood pressure 127/74, pulse 95, temperature 98.2 F (36.8 C), temperature source Oral, resp. rate 16, height 5' 10"  (1.778 m), weight 62.1 kg (136 lb 14.5 oz), SpO2 95.00%.  General: alert & oriented x 3 In NAD  Cardiovascular: RRR, nl S1 s2  Respiratory: Decreased breath sounds at the bases, scattered rhonchi, no crackles  Abdomen: soft +BS NT/ND, no masses palpable  Extremities: No cyanosis and no edema        Discharge Instructions   Call MD for:  difficulty breathing, headache or visual disturbances    Complete by:  As directed      Call MD for:  persistant dizziness or light-headedness    Complete by:  As directed      Call MD for:  severe uncontrolled pain    Complete by:  As directed      Diet  - low sodium heart healthy    Complete by:  As directed      Increase activity slowly  Complete by:  As directed            Follow-up Information   Follow up with No PCP Per Patient.   Specialty:  General Practice      Follow up with PCP. Schedule an appointment as soon as possible for a visit in 1 week. [facility] followup)       Signed: Reyne Dumas 08/07/2014, 11:38 AM

## 2014-08-07 NOTE — Evaluation (Signed)
Physical Therapy Evaluation Patient Details Name: Gregory [name] MRN: [id number] DOB: 1946-08-12 Today's Date: 08/07/2014   History of Present Illness  Gregory [name] is a 68 y.o. male with PMH of Terminal Prostate Cancer with extensive bony mets, COPD on 3L home O2, ongoing tobacco use 1PPD, H/o prophylactic Hip pinning, h/o severe anemia, was hospitalized in July at [facility] in [place], [place] with GI bleed and anemia was transfused 5units PRBC at the time.    Clinical Impression  Pt at supervision level with mobility with AD, ambulated 200' Recommend HHPT to address generalized weakness. PT signing off at this point for d.c home today.     Follow Up Recommendations Home health PT    Equipment Recommendations  None recommended by PT    Recommendations for Other Services       Precautions / Restrictions Precautions Precautions: Fall Restrictions Weight Bearing Restrictions: No      Mobility  Bed Mobility               General bed mobility comments: pt received sitting in chair'  Transfers Overall transfer level: Needs assistance Equipment used: Rolling walker (2 wheeled) Transfers: Sit to/from Stand Sit to Stand: Supervision         General transfer comment: pt stands slowly but with no LOB or need for physical assist  Ambulation/Gait Ambulation/Gait assistance: Supervision Ambulation Distance (Feet): 200 Feet Assistive device: Rolling walker (2 wheeled) Gait Pattern/deviations: Step-through pattern;Decreased stride length Gait velocity: decreased Gait velocity interpretation: Below normal speed for age/gender General Gait Details: pt slow but steady, safe with RW  Stairs            Wheelchair Mobility    Modified Rankin (Stroke Patients Only)       Balance Overall balance assessment: Needs assistance Sitting-balance support: No upper extremity supported;Feet supported Sitting balance-Leahy Scale: Good     Standing balance support:  Bilateral upper extremity supported;During functional activity Standing balance-Leahy Scale: Fair Standing balance comment: no UE support needed for static stance, UE support for dynamic activity for safety                             Pertinent Vitals/Pain Pain Assessment: No/denies pain O2 sats 95% with ambulation on 2L O2, Hr 110 bpm    Home Living Family/patient expects to be discharged to:: Private residence Living Arrangements: Other relatives Available Help at Discharge: Family;Available 24 hours/day Type of Home: House Home Access: Stairs to enter   [employer] of Steps: 2 Home Layout: One level Home Equipment: Walker - 2 wheels;Bedside commode;Shower seat;Wheelchair - manual Additional Comments: pt lives with sister and her husband, sister is retired and cares for pt    Prior Function Level of Independence: Needs Water engineer / Transfers Assistance Needed: independent with RW  ADL's / Homemaking Assistance Needed: family assists with bathing and dressing  Comments: pt recently moved here from [place] to be with family     Hand Dominance        Extremity/Trunk Assessment   Upper Extremity Assessment: Generalized weakness           Lower Extremity Assessment: Generalized weakness      Cervical / Trunk Assessment: Normal  Communication   Communication: No difficulties  Cognition Arousal/Alertness: Awake/alert Behavior During Therapy: WFL for tasks assessed/performed Overall Cognitive Status: History of cognitive impairments - at baseline       Memory: Decreased short-term memory  General Comments      Exercises        Assessment/Plan    PT Assessment All further PT needs can be met in the next venue of care  PT Diagnosis Generalized weakness   PT Problem List Decreased strength;Decreased activity tolerance;Decreased mobility;Decreased balance  PT Treatment Interventions     PT Goals (Current goals  can be found in the Care Plan section) Acute Rehab PT Goals Patient Stated Goal: return home PT Goal Formulation: No goals set, d/c therapy    Frequency     Barriers to discharge        Co-evaluation               End of Session Equipment Utilized During Treatment: Gait belt;Oxygen Activity Tolerance: Patient tolerated treatment well Patient left: in chair;with call bell/phone within reach;with chair alarm set Nurse Communication: Mobility status         Time: 4320-0379 PT Time Calculation (min): 31 min   Charges:   PT Evaluation $Initial PT Evaluation Tier I: 1 Procedure PT Treatments $Gait Training: 23-37 mins   PT G Codes:        Leighton Roach, PT  Acute Rehab Services  [place], [place] 08/07/2014, 2:16 PM

## 2014-08-07 NOTE — Evaluation (Signed)
Clinical/Bedside Swallow Evaluation Patient Details  Name: Gregory [name] MRN: [id number] Date of Birth: 1946/05/17  Today's Date: 08/07/2014 Time: 0817-0829 SLP Time Calculation (min): 12 min  Past Medical History:  Past Medical History  Diagnosis Date  . COPD (chronic obstructive pulmonary disease)   . Asthma   . Cancer     prostate with mets   Past Surgical History: History reviewed. No pertinent past surgical history. HPI:  Gregory [name] is a 68 y.o. 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  Per MD note recently been under Hospice care in [place] and just moved to [place] 1 month, admitted with shortness of breath, weakness, failure to thrive.  XCR Diffuse sclerotic metastatic disease. Accentuated interstitial markings, nonspecific, without consolidative infiltrates.   Assessment / Plan / Recommendation Clinical Impression  No overt indications aspiration observed; vocal quality clear, no cough or throat clear.  Mildly decreased oral cohesion and delayed transit with solid.  Pt. states difficulty with po's if he consumes large bite/sip.  SLP educated pt. on general precautions (small sips/bites, no straws- pt. does not like them, sit upright).  Recommend Dys 2 texture (no dentures present) and thin  liquids via cup, pills whole in applesauce.  No further ST needed.    Aspiration Risk  Mild    Diet Recommendation Dysphagia 3 (Mechanical Soft);Thin liquid   Liquid Administration via: Cup;No straw Medication Administration: Whole meds with puree Supervision: Patient able to self feed Compensations: Slow rate;Small sips/bites Postural Changes and/or Swallow Maneuvers: Seated upright 90 degrees    Other  Recommendations Oral Care Recommendations: Oral care BID   Follow Up Recommendations  None    Frequency and Duration        Pertinent Vitals/Pain No indications pain         Swallow Study            Oral/Motor/Sensory Function Overall Oral Motor/Sensory Function: Appears within functional limits for tasks assessed   Ice Chips Ice chips: Not tested   Thin Liquid Thin Liquid: Within functional limits Presentation: Cup    Nectar Thick Nectar Thick Liquid: Not tested   Honey Thick Honey Thick Liquid: Not tested   Puree Puree: Within functional limits   Solid   GO    Solid: Impaired Oral Phase Functional Implications:  (mild delayed transit) Pharyngeal Phase Impairments:  (none)       Houston Siren 08/07/2014,8:40 AM Orbie Pyo Colvin Caroli.Ed [employer] [phone]

## 2014-08-08 ENCOUNTER — Encounter: Payer: Self-pay | Admitting: *Deleted

## 2014-08-09 ENCOUNTER — Ambulatory Visit (HOSPITAL_BASED_OUTPATIENT_CLINIC_OR_DEPARTMENT_OTHER): Admitting: Oncology

## 2014-08-09 ENCOUNTER — Encounter: Payer: Self-pay | Admitting: Oncology

## 2014-08-09 ENCOUNTER — Other Ambulatory Visit: Payer: Self-pay

## 2014-08-09 ENCOUNTER — Ambulatory Visit: Payer: Medicare Other

## 2014-08-09 VITALS — HR 106 | Temp 98.7°F | Resp 18 | Ht 70.0 in | Wt 135.2 lb

## 2014-08-09 DIAGNOSIS — C7952 Secondary malignant neoplasm of bone marrow: Secondary | ICD-10-CM

## 2014-08-09 DIAGNOSIS — C7951 Secondary malignant neoplasm of bone: Secondary | ICD-10-CM

## 2014-08-09 DIAGNOSIS — C61 Malignant neoplasm of prostate: Secondary | ICD-10-CM

## 2014-08-09 NOTE — Progress Notes (Signed)
Checked in new patient with no financial issues prior to seeing the dr. He has appt card and has [place] been out of the country. He just moved from [facility] so no PCP yet

## 2014-08-09 NOTE — Progress Notes (Signed)
Reason for Referral: Prostate cancer.   HPI: Gregory [name] is a pleasant 68 year old African American gentleman recently relocated from [place] to this area. He is a gentleman with a history of COPD as well as advanced prostate cancer. The details of his prostate cancer unfortunately is not available to me. Per his report, he was diagnosed many years ago and received definitive radiation therapy. He subsequently developed advanced metastatic bony disease and had had multiple treatments. The most recent of which was systemic chemotherapy and that has been discontinued due to progression of disease. He was enrolled in hospice while he is in [place] but his condition continued to decline and was relocated to be with his sister in this area. He was hospitalized between 9/12 and 08/07/2014 for failure to thrive, respiratory distress and anemia. He was discharged and reasonable improved condition. Clinically, this is discharge done slightly better. His pain is reasonably improved. He is wheelchair bound but uses a walker for short distances. His breathing has been reasonable. His performance status is very poor however. He does not report any fevers chills or sweats. He does report poor by mouth intake. His upward any headaches or blurry vision or syncope. His report any chest pain but does report shortness of breath and dyspnea exertion. His upward any cough or hemoptysis. Does not report any nausea, vomiting, diarrhea or constipation. He reports no genitourinary complaints. Rest of his review of systems unremarkable.  Past Medical History  Diagnosis Date  . COPD (chronic obstructive pulmonary disease)   . Asthma   . Cancer     prostate with mets  :  Current Outpatient Prescriptions  Medication Sig Dispense Refill  . ALPRAZolam (XANAX) 0.5 MG tablet Take 0.5 mg by mouth 3 (three) times daily.      . cholecalciferol (VITAMIN D) 1000 UNITS tablet Take 1,000 Units by mouth daily.      Marland Kitchen dexamethasone (DECADRON) 4  MG tablet Take 4 mg by mouth 2 (two) times daily with a meal.      . ipratropium-albuterol (DUONEB) 0.5-2.5 (3) MG/3ML SOLN Take 3 mLs by nebulization every 6 (six) hours.  360 mL  12  . levofloxacin (LEVAQUIN) 500 MG tablet Take 1 tablet (500 mg total) by mouth daily.  4 tablet  0  . methadone (DOLOPHINE) 10 MG tablet Take 15 mg by mouth every 8 (eight) hours.      Marland Kitchen morphine (MSIR) 15 MG tablet Take 15 mg by mouth every 3 (three) hours as needed for severe pain.      . pantoprazole (PROTONIX) 40 MG tablet Take 40 mg by mouth 2 (two) times daily.      . predniSONE (DELTASONE) 20 MG tablet Take 10-60 mg by mouth 2 (two) times daily with a meal.       . senna-docusate (SENOKOT-S) 8.6-50 MG per tablet Take 1 tablet by mouth 2 (two) times daily.       No current facility-administered medications for this visit.         No Known Allergies:  No family history on file.:  History   Social History  . Marital Status: Single    Spouse Name: N/A    Number of Children: N/A  . Years of Education: N/A   Occupational History  . Not on file.   Social History Main Topics  . Smoking status: Former Smoker    Types: Cigarettes  . Smokeless tobacco: Not on file  . Alcohol Use: No  . Drug Use: No  .  Sexual Activity: Not on file   Other Topics Concern  . Not on file   Social History Narrative  . No narrative on file  :  Pertinent items are noted in HPI.  Exam: ECOG 3 Pulse 106, temperature 98.7 F (37.1 C), resp. rate 18, height 5\' 10"  (1.778 m), weight 135 lb 3.2 oz (61.326 kg), SpO2 100.00%. General appearance: alert, awake cachectic appearing in mild distress. Head: Normocephalic, without obvious abnormality Throat: lips, mucosa, and tongue normal; teeth and gums normal Neck: no adenopathy Back: negative Resp: Scattered rhonchi and wheezes bilaterally. No dullness to percussion. Cardio: regular rate and rhythm, S1, S2 normal, no murmur, click, rub or gallop GI: soft,  non-tender; bowel sounds normal; no masses,  no organomegaly Extremities: extremities normal, atraumatic, no cyanosis or edema Pulses: 2+ and symmetric Skin: Skin color, texture, turgor normal. No rashes or lesions Lymph nodes: Cervical, supraclavicular, and axillary nodes normal.   Recent Labs  08/07/14 0454  WBC 4.6  HGB 8.0*  HCT 25.3*  PLT 111*    Recent Labs  08/07/14 0454  NA 138  K 3.5*  CL 98  CO2 27  GLUCOSE 137*  BUN 17  CREATININE 0.38*  CALCIUM 6.7*      Dg Chest Port 1 View  08/05/2014   CLINICAL DATA:  Shortness of breath.  Trauma.  EXAM: PORTABLE CHEST - 1 VIEW  COMPARISON:  None  FINDINGS: The bones are diffusely sclerotic consistent with metastatic prostate cancer. Port-A-Cath in place. Heart size and vascularity are normal. The interstitial markings are diffusely slightly accentuated. No effusions or infiltrates.  IMPRESSION: Diffuse sclerotic metastatic disease. Accentuated interstitial markings, nonspecific, without consolidative infiltrates. No prior studies available for comparison.   Electronically Signed   By: Rozetta Nunnery M.D.   On: 08/05/2014 13:15    Assessment and Plan:   68 year old gentleman presumed metastatic prostate cancer with disease to the bone. Looking at his chest x-ray that was done on 08/05/2014 showed that he has diffuse sclerotic metastatic disease with a Port-A-Cath in place. Although I don't have the details of his prostate cancer treatment history, this gentleman appears in very poor condition and not really a candidate for any therapy at this time. He is already under hospice care and I see no need to change this course of action. I explained to him that his life expectancy is very limited and I think the goal would be palliative in nature. The focus should be on symptom management rather than treating his prostate cancer. All his questions were answered today to his satisfaction and will be happy to see him in followup as needed.

## 2014-09-07 ENCOUNTER — Telehealth: Payer: Self-pay | Admitting: Oncology

## 2014-09-07 ENCOUNTER — Other Ambulatory Visit: Payer: Self-pay | Admitting: Oncology

## 2014-09-07 DIAGNOSIS — C7951 Secondary malignant neoplasm of bone: Principal | ICD-10-CM

## 2014-09-07 DIAGNOSIS — C61 Malignant neoplasm of prostate: Secondary | ICD-10-CM

## 2014-09-07 NOTE — Telephone Encounter (Signed)
LM for appt. 09/29/14 per 09/07/14 POF, Mailed avs w/ appt d/t-Amber

## 2014-09-29 ENCOUNTER — Ambulatory Visit (HOSPITAL_BASED_OUTPATIENT_CLINIC_OR_DEPARTMENT_OTHER): Admitting: Oncology

## 2014-09-29 ENCOUNTER — Encounter (HOSPITAL_COMMUNITY): Payer: Self-pay | Admitting: *Deleted

## 2014-09-29 ENCOUNTER — Inpatient Hospital Stay (HOSPITAL_COMMUNITY)
Admission: EM | Admit: 2014-09-29 | Discharge: 2014-10-03 | DRG: 378 | Disposition: A | Discharge status: Hospice | Attending: Internal Medicine | Admitting: Internal Medicine

## 2014-09-29 ENCOUNTER — Other Ambulatory Visit (HOSPITAL_BASED_OUTPATIENT_CLINIC_OR_DEPARTMENT_OTHER)

## 2014-09-29 ENCOUNTER — Inpatient Hospital Stay (HOSPITAL_COMMUNITY)

## 2014-09-29 VITALS — BP 110/61 | HR 91 | Temp 99.0°F | Resp 18 | Ht 70.0 in | Wt 145.1 lb

## 2014-09-29 DIAGNOSIS — E440 Moderate protein-calorie malnutrition: Secondary | ICD-10-CM | POA: Diagnosis present

## 2014-09-29 DIAGNOSIS — R062 Wheezing: Secondary | ICD-10-CM

## 2014-09-29 DIAGNOSIS — K625 Hemorrhage of anus and rectum: Secondary | ICD-10-CM | POA: Diagnosis present

## 2014-09-29 DIAGNOSIS — F17210 Nicotine dependence, cigarettes, uncomplicated: Secondary | ICD-10-CM | POA: Diagnosis present

## 2014-09-29 DIAGNOSIS — J441 Chronic obstructive pulmonary disease with (acute) exacerbation: Secondary | ICD-10-CM | POA: Diagnosis present

## 2014-09-29 DIAGNOSIS — Z79899 Other long term (current) drug therapy: Secondary | ICD-10-CM | POA: Diagnosis not present

## 2014-09-29 DIAGNOSIS — E876 Hypokalemia: Secondary | ICD-10-CM | POA: Diagnosis present

## 2014-09-29 DIAGNOSIS — Z6820 Body mass index (BMI) 20.0-20.9, adult: Secondary | ICD-10-CM

## 2014-09-29 DIAGNOSIS — C7951 Secondary malignant neoplasm of bone: Secondary | ICD-10-CM | POA: Diagnosis present

## 2014-09-29 DIAGNOSIS — D500 Iron deficiency anemia secondary to blood loss (chronic): Secondary | ICD-10-CM | POA: Diagnosis present

## 2014-09-29 DIAGNOSIS — D61818 Other pancytopenia: Secondary | ICD-10-CM | POA: Diagnosis present

## 2014-09-29 DIAGNOSIS — Z9981 Dependence on supplemental oxygen: Secondary | ICD-10-CM

## 2014-09-29 DIAGNOSIS — D649 Anemia, unspecified: Secondary | ICD-10-CM | POA: Diagnosis present

## 2014-09-29 DIAGNOSIS — Z515 Encounter for palliative care: Secondary | ICD-10-CM

## 2014-09-29 DIAGNOSIS — C61 Malignant neoplasm of prostate: Secondary | ICD-10-CM

## 2014-09-29 DIAGNOSIS — K922 Gastrointestinal hemorrhage, unspecified: Secondary | ICD-10-CM | POA: Diagnosis present

## 2014-09-29 DIAGNOSIS — R748 Abnormal levels of other serum enzymes: Secondary | ICD-10-CM | POA: Diagnosis present

## 2014-09-29 DIAGNOSIS — D62 Acute posthemorrhagic anemia: Secondary | ICD-10-CM | POA: Diagnosis present

## 2014-09-29 DIAGNOSIS — Z923 Personal history of irradiation: Secondary | ICD-10-CM

## 2014-09-29 DIAGNOSIS — R1084 Generalized abdominal pain: Secondary | ICD-10-CM | POA: Diagnosis present

## 2014-09-29 DIAGNOSIS — Z79891 Long term (current) use of opiate analgesic: Secondary | ICD-10-CM | POA: Diagnosis not present

## 2014-09-29 DIAGNOSIS — K921 Melena: Secondary | ICD-10-CM

## 2014-09-29 DIAGNOSIS — K2960 Other gastritis without bleeding: Secondary | ICD-10-CM | POA: Diagnosis present

## 2014-09-29 DIAGNOSIS — D696 Thrombocytopenia, unspecified: Secondary | ICD-10-CM

## 2014-09-29 DIAGNOSIS — B3781 Candidal esophagitis: Secondary | ICD-10-CM | POA: Diagnosis present

## 2014-09-29 DIAGNOSIS — K2950 Unspecified chronic gastritis without bleeding: Secondary | ICD-10-CM | POA: Diagnosis present

## 2014-09-29 DIAGNOSIS — D630 Anemia in neoplastic disease: Secondary | ICD-10-CM | POA: Diagnosis present

## 2014-09-29 HISTORY — DX: Anemia, unspecified: D64.9

## 2014-09-29 LAB — CBC WITH DIFFERENTIAL/PLATELET
BASO%: 0.6 % (ref 0.0–2.0)
BASOS PCT: 1 % (ref 0–1)
Basophils Absolute: 0 10*3/uL (ref 0.0–0.1)
Basophils Absolute: 0 10*3/uL (ref 0.0–0.1)
EOS PCT: 1 % (ref 0–5)
EOS%: 1 % (ref 0.0–7.0)
Eosinophils Absolute: 0.1 10*3/uL (ref 0.0–0.5)
Eosinophils Absolute: 0 10*3/uL (ref 0.0–0.7)
HCT: 17.7 % — ABNORMAL LOW (ref 39.0–52.0)
HEMATOCRIT: 19 % — AB (ref 38.4–49.9)
HEMOGLOBIN: 5.4 g/dL — AB (ref 13.0–17.0)
HGB: 5.7 g/dL — CL (ref 13.0–17.1)
LYMPH%: 20.5 % (ref 14.0–49.0)
LYMPHS PCT: 25 % (ref 12–46)
Lymphs Abs: 1.1 10*3/uL (ref 0.7–4.0)
MCH: 28.8 pg (ref 27.2–33.4)
MCH: 29.8 pg (ref 26.0–34.0)
MCHC: 30 g/dL — AB (ref 32.0–36.0)
MCHC: 30.5 g/dL (ref 30.0–36.0)
MCV: 96 fL (ref 79.3–98.0)
MCV: 97.8 fL (ref 78.0–100.0)
MONO ABS: 0.5 10*3/uL (ref 0.1–1.0)
MONO#: 0.6 10*3/uL (ref 0.1–0.9)
MONO%: 11.8 % (ref 0.0–14.0)
Monocytes Relative: 12 % (ref 3–12)
NEUT#: 3.3 10*3/uL (ref 1.5–6.5)
NEUT%: 66.1 % (ref 39.0–75.0)
NRBC: 5 % — AB (ref 0–0)
Neutro Abs: 2.7 10*3/uL (ref 1.7–7.7)
Neutrophils Relative %: 61 % (ref 43–77)
Platelets: 120 10*3/uL — ABNORMAL LOW (ref 140–400)
Platelets: 92 10*3/uL — ABNORMAL LOW (ref 150–400)
RBC: 1.98 10*6/uL — AB (ref 4.20–5.82)
RBC: 1.81 MIL/uL — ABNORMAL LOW (ref 4.22–5.81)
RDW: 22.8 % — ABNORMAL HIGH (ref 11.0–14.6)
RDW: 22.5 % — ABNORMAL HIGH (ref 11.5–15.5)
WBC: 4.9 10*3/uL (ref 4.0–10.3)
WBC: 4.3 10*3/uL (ref 4.0–10.5)
lymph#: 1 10*3/uL (ref 0.9–3.3)

## 2014-09-29 LAB — ABO/RH: ABO/RH(D): O POS

## 2014-09-29 LAB — COMPREHENSIVE METABOLIC PANEL
ALK PHOS: 832 U/L — AB (ref 39–117)
ALT: 7 U/L (ref 0–53)
AST: 13 U/L (ref 0–37)
Albumin: 2.4 g/dL — ABNORMAL LOW (ref 3.5–5.2)
Anion gap: 16 — ABNORMAL HIGH (ref 5–15)
BUN: 28 mg/dL — ABNORMAL HIGH (ref 6–23)
CO2: 23 mEq/L (ref 19–32)
Calcium: 7.9 mg/dL — ABNORMAL LOW (ref 8.4–10.5)
Chloride: 104 mEq/L (ref 96–112)
Creatinine, Ser: 0.72 mg/dL (ref 0.50–1.35)
GFR calc Af Amer: >90 mL/min (ref >90)
GFR calc non Af Amer: >90 mL/min (ref >90)
Glucose, Bld: 113 mg/dL — ABNORMAL HIGH (ref 70–99)
POTASSIUM: 3.8 meq/L (ref 3.7–5.3)
Sodium: 143 mEq/L (ref 137–147)
TOTAL PROTEIN: 7.1 g/dL (ref 6.0–8.3)

## 2014-09-29 LAB — PHOSPHORUS: Phosphorus: 2.5 mg/dL (ref 2.3–4.6)

## 2014-09-29 LAB — COMPREHENSIVE METABOLIC PANEL (CC13)
ALT: 10 U/L (ref 0–55)
ANION GAP: 12 meq/L — AB (ref 3–11)
AST: 14 U/L (ref 5–34)
Albumin: 2.4 g/dL — ABNORMAL LOW (ref 3.5–5.0)
Alkaline Phosphatase: 872 U/L — ABNORMAL HIGH (ref 40–150)
BUN: 27.3 mg/dL — AB (ref 7.0–26.0)
CALCIUM: 8 mg/dL — AB (ref 8.4–10.4)
CO2: 22 mEq/L (ref 22–29)
CREATININE: 0.8 mg/dL (ref 0.7–1.3)
Chloride: 103 mEq/L (ref 98–109)
Glucose: 133 mg/dl (ref 70–140)
Potassium: 3.5 mEq/L (ref 3.5–5.1)
Sodium: 137 mEq/L (ref 136–145)
Total Bilirubin: <0.2 mg/dL (ref 0.20–1.20)
Total Protein: 7 g/dL (ref 6.4–8.3)

## 2014-09-29 LAB — RETICULOCYTES
RBC.: 1.82 MIL/uL — ABNORMAL LOW (ref 4.22–5.81)
Retic Count, Absolute: 34.6 10*3/uL (ref 19.0–186.0)
Retic Ct Pct: 1.9 % (ref 0.4–3.1)

## 2014-09-29 LAB — TECHNOLOGIST REVIEW

## 2014-09-29 LAB — APTT: aPTT: 33 seconds (ref 24–37)

## 2014-09-29 LAB — PREPARE RBC (CROSSMATCH)

## 2014-09-29 LAB — PROTIME-INR
INR: 1.25 (ref 0.00–1.49)
Prothrombin Time: 15.9 seconds — ABNORMAL HIGH (ref 11.6–15.2)

## 2014-09-29 LAB — HOLD TUBE, BLOOD BANK

## 2014-09-29 LAB — POC OCCULT BLOOD, ED: Fecal Occult Bld: NEGATIVE

## 2014-09-29 LAB — MAGNESIUM: MAGNESIUM: 2.9 mg/dL — AB (ref 1.5–2.5)

## 2014-09-29 MED ORDER — DEXAMETHASONE 4 MG PO TABS
4.0000 mg | ORAL_TABLET | Freq: Two times a day (BID) | ORAL | Status: DC
  Filled 2014-09-29 (×3): qty 1

## 2014-09-29 MED ORDER — SODIUM CHLORIDE 0.9 % IV SOLN: 250.0000 mL | INTRAVENOUS | Status: DC | PRN

## 2014-09-29 MED ORDER — PANTOPRAZOLE SODIUM 40 MG IV SOLR
40.0000 mg | Freq: Two times a day (BID) | INTRAVENOUS | Status: DC
  Administered 2014-09-29 – 2014-10-03 (×8): 40 mg via INTRAVENOUS
  Filled 2014-09-29 (×9): qty 40

## 2014-09-29 MED ORDER — ONDANSETRON HCL 4 MG PO TABS: 4.0000 mg | ORAL_TABLET | Freq: Four times a day (QID) | ORAL | Status: DC | PRN

## 2014-09-29 MED ORDER — IPRATROPIUM-ALBUTEROL 0.5-2.5 (3) MG/3ML IN SOLN
3.0000 mL | Freq: Four times a day (QID) | RESPIRATORY_TRACT | Status: DC
Start: 2014-09-29 — End: 2014-10-01
  Administered 2014-09-29 – 2014-10-01 (×9): 3 mL via RESPIRATORY_TRACT
  Filled 2014-09-29 (×9): qty 3

## 2014-09-29 MED ORDER — ONDANSETRON HCL 4 MG/2ML IJ SOLN
4.0000 mg | Freq: Once | INTRAMUSCULAR | Status: AC
  Administered 2014-09-29: 4 mg via INTRAVENOUS
  Filled 2014-09-29: qty 2

## 2014-09-29 MED ORDER — METHADONE HCL 5 MG PO TABS
15.0000 mg | ORAL_TABLET | Freq: Three times a day (TID) | ORAL | Status: DC
  Administered 2014-09-29 – 2014-10-03 (×11): 15 mg via ORAL
  Filled 2014-09-29 (×22): qty 1

## 2014-09-29 MED ORDER — SODIUM CHLORIDE 0.9 % IJ SOLN: 3.0000 mL | INTRAMUSCULAR | Status: DC | PRN

## 2014-09-29 MED ORDER — ALPRAZOLAM 0.5 MG PO TABS
0.5000 mg | ORAL_TABLET | Freq: Three times a day (TID) | ORAL | Status: DC
  Administered 2014-09-29 – 2014-10-03 (×9): 0.5 mg via ORAL
  Filled 2014-09-29 (×9): qty 1

## 2014-09-29 MED ORDER — ACETAMINOPHEN 325 MG PO TABS: 650.0000 mg | ORAL_TABLET | Freq: Four times a day (QID) | ORAL | Status: DC | PRN

## 2014-09-29 MED ORDER — VITAMIN D3 25 MCG (1000 UNIT) PO TABS
1000.0000 [IU] | ORAL_TABLET | Freq: Every day | ORAL | Status: DC
  Administered 2014-09-30 – 2014-10-03 (×4): 1000 [IU] via ORAL
  Filled 2014-09-29 (×4): qty 1

## 2014-09-29 MED ORDER — SODIUM CHLORIDE 0.9 % IV SOLN
1000.0000 mL | INTRAVENOUS | Status: DC
  Administered 2014-09-29: 1000 mL via INTRAVENOUS

## 2014-09-29 MED ORDER — SODIUM CHLORIDE 0.9 % IV SOLN: Freq: Once | INTRAVENOUS | Status: DC

## 2014-09-29 MED ORDER — SENNOSIDES-DOCUSATE SODIUM 8.6-50 MG PO TABS
1.0000 | ORAL_TABLET | Freq: Two times a day (BID) | ORAL | Status: DC
  Administered 2014-09-29 – 2014-10-03 (×6): 1 via ORAL
  Filled 2014-09-29 (×11): qty 1

## 2014-09-29 MED ORDER — ONDANSETRON HCL 4 MG/2ML IJ SOLN: 4.0000 mg | Freq: Four times a day (QID) | INTRAMUSCULAR | Status: DC | PRN

## 2014-09-29 MED ORDER — ACETAMINOPHEN 650 MG RE SUPP: 650.0000 mg | Freq: Four times a day (QID) | RECTAL | Status: DC | PRN

## 2014-09-29 MED ORDER — MORPHINE SULFATE 15 MG PO TABS: 15.0000 mg | ORAL_TABLET | ORAL | Status: DC | PRN

## 2014-09-29 MED ORDER — SODIUM CHLORIDE 0.9 % IJ SOLN
3.0000 mL | Freq: Two times a day (BID) | INTRAMUSCULAR | Status: DC
  Administered 2014-09-30: 3 mL via INTRAVENOUS

## 2014-09-29 NOTE — Progress Notes (Signed)
Hematology and Oncology Follow Up Visit  Gregory [name] [id number] 02-29-1946 68 y.o. 09/29/2014 4:16 PM No PCP Per PatientNo ref. provider found   Principle Diagnosis: 68 year old gentleman with advanced prostate cancer with metastatic disease to the bone. The exact details of his cancer and previous treatment history is not available to me. He is currently under hospice care.   Prior Therapy: he is status post radiation therapy and hormonal therapy given to him in [place] and progressed on Taxotere chemotherapy.  Current therapy: supportive care and hospice.  Interim History:  Mr. Gregory [name] presents today for a follow-up visit. He is a gentleman I saw in consultation on 08/09/2014 with poor performance status and not a candidate for any systemic therapy. He is currently under hospice care and I was asked by Dr. Lyman Speller from hospice to reevaluate his prostate cancer condition. Upon presentation today, he was reporting symptoms of abdominal pain, hematochezia and melena. He is reporting shortness of breath and difficulty breathing. He did not report any syncope or seizures. He is wheelchair bound for the most part. He does not report any chest pain or palpitation. He continues to smoke regularly despite being on oxygen. Rest of his review of systems unremarkable.  Medications: I have reviewed the patient's current medications.  Current Outpatient Prescriptions  Medication Sig Dispense Refill  . ALPRAZolam (XANAX) 0.5 MG tablet Take 0.5 mg by mouth 3 (three) times daily.    . cholecalciferol (VITAMIN D) 1000 UNITS tablet Take 1,000 Units by mouth daily.    Marland Kitchen dexamethasone (DECADRON) 4 MG tablet Take 4 mg by mouth 2 (two) times daily with a meal.    . ipratropium-albuterol (DUONEB) 0.5-2.5 (3) MG/3ML SOLN Take 3 mLs by nebulization every 6 (six) hours. 360 mL 12  . methadone (DOLOPHINE) 10 MG tablet Take 15 mg by mouth every 8 (eight) hours.    Marland Kitchen morphine (MSIR) 15 MG tablet Take 15 mg by mouth every 3  (three) hours as needed for severe pain.    . pantoprazole (PROTONIX) 40 MG tablet Take 40 mg by mouth 2 (two) times daily.    . predniSONE (DELTASONE) 20 MG tablet Take 10-60 mg by mouth 2 (two) times daily with a meal.     . senna-docusate (SENOKOT-S) 8.6-50 MG per tablet Take 1 tablet by mouth 2 (two) times daily.     No current facility-administered medications for this visit.     Allergies: No Known Allergies  Past Medical History, Surgical history, Social history, and Family History were reviewed and updated.   Physical Exam: Blood pressure 110/61, pulse 91, temperature 99 F (37.2 C), temperature source Oral, resp. rate 18, height 5\' 10"  (1.778 m), weight 145 lb 1.6 oz (65.817 kg). ECOG: 3 General appearance: alert and cachectic Head: Normocephalic, without obvious abnormality Neck: no adenopathy Lymph nodes: Cervical, supraclavicular, and axillary nodes normal. Heart:regular rate and rhythm, S1, S2 normal, no murmur, click, rub or gallop. Tachycardic. Lung:chest clear, rales, normal symmetric air entry. Wheezing noted. Abdomin: soft, non-tender, without masses or organomegaly EXT:no erythema, induration, or nodules   Lab Results: Lab Results  Component Value Date   WBC 4.9 09/29/2014   HGB 5.7* 09/29/2014   HCT 19.0* 09/29/2014   MCV 96.0 09/29/2014   PLT 120* 09/29/2014     Chemistry      Component Value Date/Time   NA 137 09/29/2014 1538   NA 138 08/07/2014 0454   K 3.5 09/29/2014 1538   K 3.5* 08/07/2014 0454   CL 98  08/07/2014 0454   CO2 22 09/29/2014 1538   CO2 27 08/07/2014 0454   BUN 27.3* 09/29/2014 1538   BUN 17 08/07/2014 0454   CREATININE 0.8 09/29/2014 1538   CREATININE 0.38* 08/07/2014 0454      Component Value Date/Time   CALCIUM 8.0* 09/29/2014 1538   CALCIUM 6.7* 08/07/2014 0454   ALKPHOS 872* 09/29/2014 1538   ALKPHOS 426* 08/07/2014 0454   AST 14 09/29/2014 1538   AST 7 08/07/2014 0454   ALT 10 09/29/2014 1538   ALT <5 08/07/2014  0454   BILITOT <0.20 09/29/2014 1538   BILITOT 0.2* 08/07/2014 0454       Impression and Plan:  68 year old gentleman with the following issues:  1. Advanced prostate cancer with very poor performance status and not candidate for any further therapy. He is currently under hospice care and I see no role for any other hormonal therapy or chemotherapy at this time. He should be eligible for hospice care moving forward.  2. Anemia with hematochezia and melena: He is currently have developed acute GI bleed with some chronic anemia element on top of it. He is approaching hemodynamic instability and for that reason I will send him urgently to the emergency department for immediate evaluation. My required and endoscopy or colonoscopy and certainly adequate cell transfusion.  Follow-up will be as needed from an oncology standpoint.    Zola Button, MD 11/6/20154:16 PM

## 2014-09-29 NOTE — H&P (Signed)
[facility] History and Physical  Gregory [name] BTD:[id number] DOB: 1946/04/29 DOA: 09/29/2014  Referring physician: ER physician PCP: No PCP Per Patient   Chief Complaint: abnormal labs  HPI:  68 year old gentleman with past medical history of advanced prostate cancer with metastatic disease to the bone, currently under hospice care, status post radiation therapy and hormonal therapy given to him in [place] and progressed on Taxotere chemotherapy. The patient was in [place] today and has reported complaints of generalized abdominal pain, blood per rectum for past couple of weeks prior to this admission. Patient also reported having melena. Patient reports associated shortness of breath but no loss of consciousness. Patient reports no fevers or chills. No reports of diarrhea or constipation. No reports of falls. No chest pain, palpitations. In ED, patient was hemodynamically stable. Blood work revealed hemoglobin of 5.4, platelet count 92.chest x-ray showed no acute cardiopulmonary process, diffuse sclerotic osseous metastasis. Blood transfusion was not started in ED. Patient was admitted to medical floor for further management of possible lower GI bleed.  Assessment & Plan    Principal problem: GI bleed / acute blood loss anemia / anemia of chronic disease  Hemoglobin on admission is 5.4. Likely acute blood loss anemia superimposed on anemia of chronic disease secondary to malignancy. Patient will receive 2 units of PRBC transfusion.  Follow-up the post transfusion hemoglobin.  On-call GI called (Dr. Hilarie Fredrickson), will see the pt in am. May need endoscopy and colonoscopy.  We will keep patient nothing by mouth post midnight.  Continue Protonix 40 mg IV every 12 hours.  Continue supportive care with IV fluids.  Active problems: Advanced prostate cancer with osseous metastasis  Supportive care and hospice follow-up per oncology recommendations.  Pain management with methadone,  MS IR 50 mg every 3 hours as needed for moderate pain.  Continue Decadron 4 mg by mouth twice a day.  Thrombocytopenia  Likely secondary to advanced malignancy.  Platelet count 92. Continue to monitor CBC daily.    DVT prophylaxis:   SCDs for DVT prophylaxis secondary to bleed  Radiological Exams on Admission: No results found.   Code Status: Full Family Communication: Plan of care discussed with the patient  Disposition Plan: Admit for further evaluation  Leisa Lenz, MD  [facility] Pager [phone]  Review of Systems:  Constitutional: Negative for fever, chills and malaise/fatigue. Negative for diaphoresis.  HENT: Negative for hearing loss, ear pain, nosebleeds, congestion, sore throat, neck pain, tinnitus and ear discharge.   Eyes: Negative for blurred vision, double vision, photophobia, pain, discharge and redness.  Respiratory: Negative for cough, hemoptysis, sputum production, shortness of breath, wheezing and stridor.   Cardiovascular: Negative for chest pain, palpitations, orthopnea, claudication and leg swelling.  Gastrointestinal: per HPI Genitourinary: Negative for dysuria, urgency, frequency, hematuria and flank pain.  Musculoskeletal: Negative for myalgias, back pain, joint pain and falls.  Skin: Negative for itching and rash.  Neurological: Negative for dizziness and weakness. Negative for tingling, tremors, sensory change, speech change, focal weakness, loss of consciousness and headaches.  Endo/Heme/Allergies: Negative for environmental allergies and polydipsia. Does not bruise/bleed easily.  Psychiatric/Behavioral: Negative for suicidal ideas. The patient is not nervous/anxious.      Past Medical History  Diagnosis Date  . COPD (chronic obstructive pulmonary disease)   . Asthma   . Cancer     prostate with mets  . Anemia    Past Surgical History  Procedure Laterality Date  . Hip surgery  April 2015    "Pin put  in as preventative"   Social  History:  reports that he has been smoking Cigarettes.  He has a 50 pack-year smoking history. He has never used smokeless tobacco. He reports that he drinks alcohol. He reports that he does not use illicit drugs.  No Known Allergies  Family History: htn in family    Prior to Admission medications   Medication Sig Start Date End Date Taking? Authorizing Provider  ALPRAZolam Duanne Moron) 0.5 MG tablet Take 0.5 mg by mouth 3 (three) times daily.   Yes Historical Provider, MD  cholecalciferol (VITAMIN D) 1000 UNITS tablet Take 1,000 Units by mouth daily.   Yes Historical Provider, MD  dexamethasone (DECADRON) 4 MG tablet Take 4 mg by mouth 2 (two) times daily with a meal.   Yes Historical Provider, MD  ipratropium-albuterol (DUONEB) 0.5-2.5 (3) MG/3ML SOLN Take 3 mLs by nebulization every 6 (six) hours. 08/07/14  Yes Reyne Dumas, MD  methadone (DOLOPHINE) 10 MG tablet Take 15 mg by mouth every 8 (eight) hours.   Yes Historical Provider, MD  morphine (MSIR) 15 MG tablet Take 15 mg by mouth every 3 (three) hours as needed for severe pain.   Yes Historical Provider, MD  pantoprazole (PROTONIX) 40 MG tablet Take 40 mg by mouth 2 (two) times daily.   Yes Historical Provider, MD  predniSONE (DELTASONE) 20 MG tablet Take 20-60 mg by mouth daily. Take 3 tablets once daily for 3 days. Then take 2 tablets once daily for 3 days. Then take 1 tablet once daily for 3 days. 09/29/14 10/07/14 Yes Historical Provider, MD  senna-docusate (SENOKOT-S) 8.6-50 MG per tablet Take 1 tablet by mouth 2 (two) times daily.   Yes Historical Provider, MD   Physical Exam: Filed Vitals:   09/29/14 1634  BP: 134/98  Pulse: 79  Temp: 98.3 F (36.8 C)  TempSrc: Oral  Resp: 18  SpO2: 100%    Physical Exam  Constitutional: Appears well-developed and well-nourished. No distress.  HENT: Normocephalic. No tonsillar erythema or exudates Eyes: Conjunctivae and EOM are normal. PERRLA, no scleral icterus.  Neck: Normal ROM. Neck  supple. No JVD. No tracheal deviation. No thyromegaly.  CVS: RRR, S1/S2 +, no murmurs, no gallops, no carotid bruit.  Pulmonary: Effort and breath sounds normal, no stridor, rhonchi, wheezes, rales.  Abdominal: Soft. BS +,  no distension, tenderness, rebound or guarding.  Musculoskeletal: Normal range of motion. No edema and no tenderness.  Lymphadenopathy: No lymphadenopathy noted, cervical, inguinal. Neuro: Alert. Normal reflexes, muscle tone coordination. No focal neurologic deficits. Skin: Skin is warm and dry. No rash noted. Not diaphoretic. No erythema. No pallor.  Psychiatric: Normal mood and affect. Behavior, judgment, thought content normal.   Labs on Admission:  Basic Metabolic Panel:  Recent Labs Lab 09/29/14 1538  NA 137  K 3.5  CO2 22  GLUCOSE 133  BUN 27.3*  CREATININE 0.8  CALCIUM 8.0*   Liver Function Tests:  Recent Labs Lab 09/29/14 1538  AST 14  ALT 10  ALKPHOS 872*  BILITOT <0.20  PROT 7.0  ALBUMIN 2.4*   No results for input(s): LIPASE, AMYLASE in the last 168 hours. No results for input(s): AMMONIA in the last 168 hours. CBC:  Recent Labs Lab 09/29/14 1538  WBC 4.9  NEUTROABS 3.3  HGB 5.7*  HCT 19.0*  MCV 96.0  PLT 120*   Cardiac Enzymes: No results for input(s): CKTOTAL, CKMB, CKMBINDEX, TROPONINI in the last 168 hours. BNP: Invalid input(s): POCBNP CBG: No results for input(s): GLUCAP  in the last 168 hours.  If 7PM-7AM, please contact night-coverage [email or web address] Password [facility] 09/29/2014, 5:41 PM

## 2014-09-29 NOTE — ED Notes (Signed)
REPORT GIVEN-TRANSFER TO 3RD FLOOR

## 2014-09-29 NOTE — ED Notes (Signed)
Patient was at [facility] for scheduled appointment and has some labs drawn that came back abnormal. He was then sent to the ED.

## 2014-09-29 NOTE — Plan of Care (Signed)
Problem: Phase I Progression Outcomes Goal: Pain controlled with appropriate interventions Outcome: Completed/Met Date Met:  09/29/14 Goal: Voiding-avoid urinary catheter unless indicated Outcome: Not Met (add Reason) Patient came from home with foley catheter - patient is a Hospice patient

## 2014-09-29 NOTE — ED Provider Notes (Signed)
CSN: [id number]     Arrival date & time 09/29/14  1620 History   First MD Initiated Contact with Patient 09/29/14 1632     Chief complaint: severe anemia  HPI Patient presents to the emergency room with complaints of rectal bleeding and anemia. patient has a history of prostate cancer.he is currently not getting any active treatment and is on hospice care. The patient was seen at the oncology clinic today and was sent up to the emergency room to be admitted for anemia. The patient does have a history of chronic anemia but has also noticed intermittent blood in his stool for the last several months. He has not noticed any blood today. Patient is fatigued but denies any other specific complaints. Past Medical History  Diagnosis Date  . COPD (chronic obstructive pulmonary disease)   . Asthma   . Cancer     prostate with mets   History reviewed. No pertinent past surgical history. History reviewed. No pertinent family history. History  Substance Use Topics  . Smoking status: Former Smoker    Types: Cigarettes  . Smokeless tobacco: Not on file  . Alcohol Use: No    Review of Systems  All other systems reviewed and are negative.     Allergies  Review of patient's allergies indicates no known allergies.  Home Medications   Prior to Admission medications   Medication Sig Start Date End Date Taking? Authorizing Provider  ALPRAZolam Duanne Moron) 0.5 MG tablet Take 0.5 mg by mouth 3 (three) times daily.   Yes Historical Provider, MD  cholecalciferol (VITAMIN D) 1000 UNITS tablet Take 1,000 Units by mouth daily.   Yes Historical Provider, MD  dexamethasone (DECADRON) 4 MG tablet Take 4 mg by mouth 2 (two) times daily with a meal.   Yes Historical Provider, MD  ipratropium-albuterol (DUONEB) 0.5-2.5 (3) MG/3ML SOLN Take 3 mLs by nebulization every 6 (six) hours. 08/07/14  Yes Reyne Dumas, MD  methadone (DOLOPHINE) 10 MG tablet Take 15 mg by mouth every 8 (eight) hours.   Yes Historical Provider,  MD  morphine (MSIR) 15 MG tablet Take 15 mg by mouth every 3 (three) hours as needed for severe pain.   Yes Historical Provider, MD  pantoprazole (PROTONIX) 40 MG tablet Take 40 mg by mouth 2 (two) times daily.   Yes Historical Provider, MD  predniSONE (DELTASONE) 20 MG tablet Take 20-60 mg by mouth daily. Take 3 tablets once daily for 3 days. Then take 2 tablets once daily for 3 days. Then take 1 tablet once daily for 3 days. 09/29/14 10/07/14 Yes Historical Provider, MD  senna-docusate (SENOKOT-S) 8.6-50 MG per tablet Take 1 tablet by mouth 2 (two) times daily.   Yes Historical Provider, MD   BP 134/98 mmHg  Pulse 79  Temp(Src) 98.3 F (36.8 C) (Oral)  Resp 18  SpO2 100% Physical Exam  Constitutional: No distress.  Elderly, frail  HENT:  Head: Normocephalic and atraumatic.  Right Ear: External ear normal.  Left Ear: External ear normal.  Mouth/Throat: No oropharyngeal exudate.  Eyes: Conjunctivae are normal. Right eye exhibits no discharge. Left eye exhibits no discharge. No scleral icterus.  Neck: Neck supple. No tracheal deviation present.  Cardiovascular: Normal rate, regular rhythm and intact distal pulses.   Pulmonary/Chest: Effort normal. No stridor. No respiratory distress. He has wheezes ( few wheezes noted on end expiration). He has no rales.  Abdominal: Soft. Bowel sounds are normal. He exhibits no distension (mild tenderness in the lower abdomen). There is  tenderness. There is no rebound and no guarding.  Genitourinary:  No Blood noted on rectal exam  Musculoskeletal: He exhibits no edema or tenderness.  Decreased muscle mass  Neurological: He is alert. No cranial nerve deficit (no facial droop, extraocular movements intact, no slurred speech) or sensory deficit. He exhibits normal muscle tone. He displays no seizure activity. Coordination normal.  General weakness but no focal deficits  Skin: Skin is warm and dry. No rash noted. He is not diaphoretic.  Psychiatric: He has  a normal mood and affect.  Nursing note and vitals reviewed.   ED Course  Procedures (including critical care time) Labs Review Labs Reviewed  APTT  PROTIME-INR  VITAMIN B12  FOLATE  IRON AND TIBC  FERRITIN  RETICULOCYTES  POC OCCULT BLOOD, ED  TYPE AND SCREEN  PREPARE RBC (CROSSMATCH)  the patient had laboratory tests performed today. The results are available in the [facility]. The patient has a hemoglobin of 5.7 today.  CMET notable for alk phos that is elevated.  Low protein and calcium.  Imaging Review No results found.    MDM   Final diagnoses:  Symptomatic anemia  Prostate ca  Wheezing    Patient has a significant decline in his hemoglobin. Last month his hemoglobin was 7. He was sent Into the emergency room for further evaluation.    Plan on blood transfusion for symptomatic relief.  No active bleeding noted in the ED.  Etiology likely multifactorial    Dorie Rank, MD 09/29/14 1727

## 2014-09-29 NOTE — Progress Notes (Signed)
Utilization Review completed.  Lenda Baratta RN CM  

## 2014-09-29 NOTE — Addendum Note (Signed)
Addended by: Randolm Idol on: 09/29/2014 04:35 PM   Modules accepted: Medications

## 2014-09-30 DIAGNOSIS — E876 Hypokalemia: Secondary | ICD-10-CM | POA: Diagnosis present

## 2014-09-30 DIAGNOSIS — J441 Chronic obstructive pulmonary disease with (acute) exacerbation: Secondary | ICD-10-CM | POA: Diagnosis present

## 2014-09-30 DIAGNOSIS — D696 Thrombocytopenia, unspecified: Secondary | ICD-10-CM | POA: Diagnosis present

## 2014-09-30 DIAGNOSIS — R748 Abnormal levels of other serum enzymes: Secondary | ICD-10-CM

## 2014-09-30 DIAGNOSIS — K922 Gastrointestinal hemorrhage, unspecified: Secondary | ICD-10-CM

## 2014-09-30 DIAGNOSIS — D61818 Other pancytopenia: Secondary | ICD-10-CM | POA: Diagnosis present

## 2014-09-30 DIAGNOSIS — D62 Acute posthemorrhagic anemia: Secondary | ICD-10-CM

## 2014-09-30 DIAGNOSIS — C7951 Secondary malignant neoplasm of bone: Secondary | ICD-10-CM

## 2014-09-30 DIAGNOSIS — K921 Melena: Secondary | ICD-10-CM | POA: Insufficient documentation

## 2014-09-30 LAB — COMPREHENSIVE METABOLIC PANEL
ALBUMIN: 2.1 g/dL — AB (ref 3.5–5.2)
ALK PHOS: 760 U/L — AB (ref 39–117)
ALT: 6 U/L (ref 0–53)
AST: 13 U/L (ref 0–37)
Anion gap: 13 (ref 5–15)
BUN: 22 mg/dL (ref 6–23)
CHLORIDE: 102 meq/L (ref 96–112)
CO2: 23 meq/L (ref 19–32)
CREATININE: 0.61 mg/dL (ref 0.50–1.35)
Calcium: 7.4 mg/dL — ABNORMAL LOW (ref 8.4–10.5)
GFR calc Af Amer: >90 mL/min (ref >90)
Glucose, Bld: 83 mg/dL (ref 70–99)
POTASSIUM: 3.3 meq/L — AB (ref 3.7–5.3)
SODIUM: 138 meq/L (ref 137–147)
Total Bilirubin: 0.2 mg/dL — ABNORMAL LOW (ref 0.3–1.2)
Total Protein: 6.3 g/dL (ref 6.0–8.3)

## 2014-09-30 LAB — TYPE AND SCREEN
ABO/RH(D): O POS
Antibody Screen: NEGATIVE
Unit division: 0
Unit division: 0

## 2014-09-30 LAB — CBC
HEMATOCRIT: 22.1 % — AB (ref 39.0–52.0)
Hemoglobin: 7.2 g/dL — ABNORMAL LOW (ref 13.0–17.0)
MCH: 30.3 pg (ref 26.0–34.0)
MCHC: 32.6 g/dL (ref 30.0–36.0)
MCV: 92.9 fL (ref 78.0–100.0)
Platelets: 77 10*3/uL — ABNORMAL LOW (ref 150–400)
RBC: 2.38 MIL/uL — ABNORMAL LOW (ref 4.22–5.81)
RDW: 21 % — ABNORMAL HIGH (ref 11.5–15.5)
WBC: 3.8 10*3/uL — AB (ref 4.0–10.5)

## 2014-09-30 LAB — IRON AND TIBC
IRON: 96 ug/dL (ref 42–135)
Saturation Ratios: 56 % — ABNORMAL HIGH (ref 20–55)
TIBC: 170 ug/dL — AB (ref 215–435)
UIBC: 74 ug/dL — AB (ref 125–400)

## 2014-09-30 LAB — FERRITIN: Ferritin: 2627 ng/mL — ABNORMAL HIGH (ref 22–322)

## 2014-09-30 LAB — GLUCOSE, CAPILLARY: GLUCOSE-CAPILLARY: 84 mg/dL (ref 70–99)

## 2014-09-30 LAB — PSA: PSA: 612.4 ng/mL — ABNORMAL HIGH

## 2014-09-30 LAB — VITAMIN B12: VITAMIN B 12: 450 pg/mL (ref 211–911)

## 2014-09-30 LAB — TSH: TSH: 0.741 u[IU]/mL (ref 0.350–4.500)

## 2014-09-30 LAB — FOLATE: Folate: 11.5 ng/mL

## 2014-09-30 MED ORDER — GUAIFENESIN ER 600 MG PO TB12
600.0000 mg | ORAL_TABLET | Freq: Two times a day (BID) | ORAL | Status: DC
  Administered 2014-09-30 – 2014-10-03 (×7): 600 mg via ORAL
  Filled 2014-09-30 (×8): qty 1

## 2014-09-30 MED ORDER — DEXTROMETHORPHAN POLISTIREX 30 MG/5ML PO LQCR
60.0000 mg | Freq: Two times a day (BID) | ORAL | Status: DC
  Filled 2014-09-30 (×2): qty 10

## 2014-09-30 MED ORDER — POTASSIUM CHLORIDE IN NACL 20-0.9 MEQ/L-% IV SOLN
INTRAVENOUS | Status: DC
  Administered 2014-09-30 – 2014-10-03 (×6): via INTRAVENOUS
  Filled 2014-09-30 (×7): qty 1000

## 2014-09-30 MED ORDER — AZITHROMYCIN 500 MG PO TABS
500.0000 mg | ORAL_TABLET | Freq: Every day | ORAL | Status: AC
  Administered 2014-09-30: 500 mg via ORAL
  Filled 2014-09-30: qty 1

## 2014-09-30 MED ORDER — ALBUTEROL SULFATE (2.5 MG/3ML) 0.083% IN NEBU
2.5000 mg | INHALATION_SOLUTION | RESPIRATORY_TRACT | Status: DC | PRN
Start: 2014-09-30 — End: 2014-10-03
  Administered 2014-09-30: 2.5 mg via RESPIRATORY_TRACT

## 2014-09-30 MED ORDER — DEXTROMETHORPHAN POLISTIREX 30 MG/5ML PO LQCR
60.0000 mg | Freq: Two times a day (BID) | ORAL | Status: AC
  Administered 2014-09-30 – 2014-10-02 (×6): 60 mg via ORAL
  Filled 2014-09-30 (×6): qty 10

## 2014-09-30 MED ORDER — AZITHROMYCIN 250 MG PO TABS
250.0000 mg | ORAL_TABLET | Freq: Every day | ORAL | Status: DC
  Administered 2014-10-01 – 2014-10-03 (×3): 250 mg via ORAL
  Filled 2014-09-30 (×3): qty 1

## 2014-09-30 MED ORDER — METHYLPREDNISOLONE SODIUM SUCC 40 MG IJ SOLR
40.0000 mg | Freq: Four times a day (QID) | INTRAMUSCULAR | Status: DC
  Administered 2014-09-30 – 2014-10-03 (×12): 40 mg via INTRAVENOUS
  Filled 2014-09-30 (×16): qty 1

## 2014-09-30 NOTE — Progress Notes (Signed)
Progress Note   Gregory [name] MVH:[id number] DOB: Sep 06, 1946 DOA: 09/29/2014 PCP: No PCP Per Patient   Brief Narrative:   Gregory [name] is an 68 y.o. male with a PMH of stage IV prostate cancer/bone metastasis status post radiation therapy/androgen operation therapy, as well as chemotherapy with Taxotere, now under hospice care who was admitted on 09/29/14 with a chief complaint of melena. He was admitted from the cancer center after his hemoglobin was found to be 5.4 and platelet count 92.  Assessment/Plan:   Principal Problem:   GI bleed with acute blood loss anemia in the setting of chronic anemia  Hemoglobin 5.4--->7.2 status post 2 units of PRBCs.  GI consultation pending.  Continue bowel rest and empiric PPI therapy.  Transfuse as needed to maintain hemoglobin greater than 7.  Active Problems:   COPD exacerbation  Continue Duonebs.  Change decadron to Solu-Medrol.  Add Azithromycin.  Add Mucinex/Delsym for cough.    Pancytopenia / thrombocytopenia  Likely reflective from bone marrow involvement of prostate cancer.    Hypokalemia  Add potassium to IV fluids.    Prostate cancer metastatic to bone / Elevated alkaline phosphatase  Marked elevation of alkaline phosphatase consistent with bony metastasis.  Continue steroids, pain control with methadone and MSIR.  Patient under hospice care after progression of disease status post Taxotere chemotherapy.    DVT Prophylaxis  Continue SCDs.  Code Status: Full. Family Communication: No family at the bedside. Disposition Plan: Home when stable.   IV Access:    Peripheral IV   Procedures and diagnostic studies:   Dg Chest Portable 1 View 09/29/2014: 1.  No acute cardiopulmonary process. 2. Diffuse sclerotic osseous metastasis.       Medical Consultants:    Dr. Zenovia Jarred, GI.  Anti-Infectives:    None.  Subjective:    Gregory [name] tells me he is short of breath and has "a cold".   Denies  nausea/vomiting.  No current complaints of pain.  Objective:    Filed Vitals:   09/29/14 2230 09/29/14 2302 09/30/14 0137 09/30/14 0502  BP: 130/60 128/64  122/71  Pulse: 75 82  96  Temp: 98.5 F (36.9 C) 98.2 F (36.8 C)  98.5 F (36.9 C)  TempSrc: Oral Oral  Oral  Resp: 18 18  18   Height:      Weight:    65.273 kg (143 lb 14.4 oz)  SpO2: 99% 100% 100% 100%    Intake/Output Summary (Last 24 hours) at 09/30/14 0737 Last data filed at 09/30/14 0502  Gross per 24 hour  Intake   1205 ml  Output   1050 ml  Net    155 ml    Exam: Gen:  NAD Cardiovascular:  RRR, No M/R/G Respiratory:  Lungs with diffuse expiratory wheezing Gastrointestinal:  Abdomen softly distended, + BS Extremities:  Trace edema, SCDs on   Data Reviewed:    Labs: Basic Metabolic Panel:  Recent Labs Lab 09/29/14 1538 09/29/14 1900 09/30/14 0520  NA 137 143 138  K 3.5 3.8 3.3*  CL  --  104 102  CO2 22 23 23   GLUCOSE 133 113* 83  BUN 27.3* 28* 22  CREATININE 0.8 0.72 0.61  CALCIUM 8.0* 7.9* 7.4*  MG  --  2.9*  --   PHOS  --  2.5  --    GFR Estimated Creatinine Clearance: 79.4 mL/min (by C-G formula based on Cr of 0.61). Liver Function Tests:  Recent Labs Lab 09/29/14 1538 09/29/14 1900  09/30/14 0520  AST 14 13 13   ALT 10 7 6   ALKPHOS 872* 832* 760*  BILITOT <0.20 <0.2* 0.2*  PROT 7.0 7.1 6.3  ALBUMIN 2.4* 2.4* 2.1*   Coagulation profile  Recent Labs Lab 09/29/14 1728  INR 1.25    CBC:  Recent Labs Lab 09/29/14 1538 09/29/14 1900 09/30/14 0520  WBC 4.9 4.3 3.8*  NEUTROABS 3.3 2.7  --   HGB 5.7* 5.4* 7.2*  HCT 19.0* 17.7* 22.1*  MCV 96.0 97.8 92.9  PLT 120* 92* 77*   BNP (last 3 results)  Recent Labs  08/05/14 1239  PROBNP 878.3*   Thyroid function studies:  Recent Labs  09/29/14 1900  TSH 0.741   Anemia work up:  Recent Labs  09/29/14 1728  VITAMINB12 450  FOLATE 11.5  FERRITIN 2627*  TIBC 170*  IRON 96  RETICCTPCT 1.9     Microbiology Recent Results (from the past 240 hour(s))  TECHNOLOGIST REVIEW     Status: None   Collection Time: 09/29/14  3:38 PM  Result Value Ref Range Status   Technologist Review Metas and Myelocytes present, rouleaux present  Final     Medications:   . ALPRAZolam  0.5 mg Oral TID  . cholecalciferol  1,000 Units Oral Daily  . dexamethasone  4 mg Oral BID WC  . ipratropium-albuterol  3 mL Nebulization Q6H  . methadone  15 mg Oral 3 times per day  . pantoprazole (PROTONIX) IV  40 mg Intravenous Q12H  . senna-docusate  1 tablet Oral BID  . sodium chloride  3 mL Intravenous Q12H   Continuous Infusions: . sodium chloride 1,000 mL (09/29/14 1857)    Time spent: 35 minutes with > 50% of time discussing current diagnostic test results, clinical impression and plan of care.    LOS: 1 day   Brenyn Petrey  [facility] Pager [phone]. If unable to reach me by pager, please call my cell phone at [phone].  *Please refer to [email or web address], password [email or web address] to get updated schedule on who will round on this patient, as hospitalists switch teams weekly. If 7PM-7AM, please contact night-coverage at [email or web address], password [email or web address] for any overnight needs.  09/30/2014, 7:37 AM

## 2014-09-30 NOTE — Consult Note (Signed)
Referring Provider: No ref. provider found Primary Care Physician:  No PCP Per Patient Primary Gastroenterologist:  None, unassigned  Reason for Consultation:  Black stools/GI bleeding in patient with metastatic prostate CA  HPI: Gregory [name] is a 68 y.o. male with past medical history of advanced prostate cancer with metastatic disease to the bone, currently under hospice care, status post radiation therapy and hormonal therapy given to him in [place] and progressed on Taxotere chemotherapy. The patient was in [place] on 11/6 and reported complaints of generalized abdominal pain, blood per rectum for past couple of weeks. Patient also reported having black stools. Patient reported associated shortness of breath, but no loss of consciousness.  He has COPD and is on home O2.  In ED, patient was hemodynamically stable. Blood work revealed hemoglobin of 5.4, platelet count 92.  Chest x-ray showed no acute cardiopulmonary process, diffuse sclerotic osseous metastasis.  He has since been given 2 units of PRBC's with increase in Hgb to 7.2 grams.  He was placed on pantoprazole 40 mg IV BID and was on pantoprazole 40 mg BID at home as well according to his med list.  He denies NSAID use.  He tells me that his stools have been black, no maroon or red color.  Last BM was last night and nurse reports that it was brown.  Patient reports abdominal pain as well in mid-abdomen.  Denies nausea and vomiting.  He is being treated for COPD exacerbation as well.  He would like to have procedures done if and when possible to try to determine bleeding source.   Past Medical History  Diagnosis Date  . COPD (chronic obstructive pulmonary disease)   . Asthma   . Cancer     prostate with mets  . Anemia     Past Surgical History  Procedure Laterality Date  . Hip surgery  April 2015    "Pin put in as preventative"    Prior to Admission medications   Medication Sig Start Date End Date Taking?  Authorizing Provider  ALPRAZolam Duanne Moron) 0.5 MG tablet Take 0.5 mg by mouth 3 (three) times daily.   Yes Historical Provider, MD  cholecalciferol (VITAMIN D) 1000 UNITS tablet Take 1,000 Units by mouth daily.   Yes Historical Provider, MD  dexamethasone (DECADRON) 4 MG tablet Take 4 mg by mouth 2 (two) times daily with a meal.   Yes Historical Provider, MD  ipratropium-albuterol (DUONEB) 0.5-2.5 (3) MG/3ML SOLN Take 3 mLs by nebulization every 6 (six) hours. 08/07/14  Yes Reyne Dumas, MD  methadone (DOLOPHINE) 10 MG tablet Take 15 mg by mouth every 8 (eight) hours.   Yes Historical Provider, MD  morphine (MSIR) 15 MG tablet Take 15 mg by mouth every 3 (three) hours as needed for severe pain.   Yes Historical Provider, MD  pantoprazole (PROTONIX) 40 MG tablet Take 40 mg by mouth 2 (two) times daily.   Yes Historical Provider, MD  predniSONE (DELTASONE) 20 MG tablet Take 20-60 mg by mouth daily. Take 3 tablets once daily for 3 days. Then take 2 tablets once daily for 3 days. Then take 1 tablet once daily for 3 days. 09/29/14 10/07/14 Yes Historical Provider, MD  senna-docusate (SENOKOT-S) 8.6-50 MG per tablet Take 1 tablet by mouth 2 (two) times daily.   Yes Historical Provider, MD    Current Facility-Administered Medications  Medication Dose Route Frequency Provider Last Rate Last Dose  . 0.9 %  sodium chloride infusion  250 mL Intravenous PRN  Robbie Lis, MD      . 0.9 % NaCl with KCl 20 mEq/ L  infusion   Intravenous Continuous Christina P Rama, MD      . acetaminophen (TYLENOL) tablet 650 mg  650 mg Oral Q6H PRN Robbie Lis, MD       Or  . acetaminophen (TYLENOL) suppository 650 mg  650 mg Rectal Q6H PRN Robbie Lis, MD      . ALPRAZolam Duanne Moron) tablet 0.5 mg  0.5 mg Oral TID Robbie Lis, MD   0.5 mg at 09/29/14 2212  . azithromycin (ZITHROMAX) tablet 500 mg  500 mg Oral Daily Venetia Maxon Rama, MD       Followed by  . [START ON 10/01/2014] azithromycin (ZITHROMAX) tablet 250 mg  250 mg  Oral Daily Christina P Rama, MD      . cholecalciferol (VITAMIN D) tablet 1,000 Units  1,000 Units Oral Daily Robbie Lis, MD      . ipratropium-albuterol (DUONEB) 0.5-2.5 (3) MG/3ML nebulizer solution 3 mL  3 mL Nebulization Q6H Robbie Lis, MD   3 mL at 09/30/14 0740  . methadone (DOLOPHINE) tablet 15 mg  15 mg Oral 3 times per day Robbie Lis, MD   15 mg at 09/30/14 0506  . methylPREDNISolone sodium succinate (SOLU-MEDROL) 40 mg/mL injection 40 mg  40 mg Intravenous Q6H Christina P Rama, MD      . morphine (MSIR) tablet 15 mg  15 mg Oral Q3H PRN Robbie Lis, MD      . ondansetron [facility]) tablet 4 mg  4 mg Oral Q6H PRN Robbie Lis, MD       Or  . ondansetron [facility]) injection 4 mg  4 mg Intravenous Q6H PRN Robbie Lis, MD      . pantoprazole (PROTONIX) injection 40 mg  40 mg Intravenous Q12H Robbie Lis, MD   40 mg at 09/29/14 2235  . senna-docusate (Senokot-S) tablet 1 tablet  1 tablet Oral BID Robbie Lis, MD   1 tablet at 09/29/14 2211  . sodium chloride 0.9 % injection 3 mL  3 mL Intravenous Q12H Robbie Lis, MD   3 mL at 09/29/14 2212  . sodium chloride 0.9 % injection 3 mL  3 mL Intravenous PRN Robbie Lis, MD        Allergies as of 09/29/2014  . (No Known Allergies)    History reviewed. No pertinent family history.  History   Social History  . Marital Status: Single    Spouse Name: N/A    Number of Children: N/A  . Years of Education: N/A   Occupational History  . Not on file.   Social History Main Topics  . Smoking status: Current Every Day Smoker -- 1.00 packs/day for 50 years    Types: Cigarettes  . Smokeless tobacco: Never Used  . Alcohol Use: Yes     Comment: "social drinker"  . Drug Use: No  . Sexual Activity: Not on file   Other Topics Concern  . Not on file   Social History Narrative    Review of Systems: Ten point ROS is O/W negative except as mentioned in HPI.  Physical Exam: Vital signs in last 24 hours: Temp:  [98.2 F  (36.8 C)-99 F (37.2 C)] 98.5 F (36.9 C) (11/07 0502) Pulse Rate:  [75-96] 96 (11/07 0502) Resp:  [16-18] 18 (11/07 0502) BP: (110-134)/(56-98) 122/71 mmHg (11/07 0502) SpO2:  [98 %-100 %]  98 % (11/07 0740) Weight:  [143 lb 14.4 oz (65.273 kg)-145 lb 1.6 oz (65.817 kg)] 143 lb 14.4 oz (65.273 kg) (11/07 0502) Last BM Date: 09/28/14 General:  Alert.  Chronically ill-appearing, in NAD Head:  Normocephalic and atraumatic. Eyes:  Sclera clear, no icterus.   Conjunctiva pale. Ears:  Normal auditory acuity. Mouth:  No deformity or lesions.  Edentulous.  Lungs:  Expiratory wheezing B/L. Heart:  Regular. Abdomen:  Soft, non-distended.  BS present.  Epigastric TTP without R/R/G.   Rectal:  Deferred  Msk:  Symmetrical without gross deformities. Pulses:  Normal pulses noted. Extremities:  Without clubbing or edema. Neurologic:  Alert and  oriented x4;  grossly normal neurologically. Skin:  Intact without significant lesions or rashes. Psych:  Alert and cooperative. Normal mood and affect.  Intake/Output from previous day: 11/06 0701 - 11/07 0700 In: 1205 [P.O.:25; I.V.:500; Blood:680] Out: 1050 [Urine:1050]  Lab Results:  Recent Labs  09/29/14 1538 09/29/14 1900 09/30/14 0520  WBC 4.9 4.3 3.8*  HGB 5.7* 5.4* 7.2*  HCT 19.0* 17.7* 22.1*  PLT 120* 92* 77*   BMET  Recent Labs  09/29/14 1538 09/29/14 1900 09/30/14 0520  NA 137 143 138  K 3.5 3.8 3.3*  CL  --  104 102  CO2 22 23 23   GLUCOSE 133 113* 83  BUN 27.3* 28* 22  CREATININE 0.8 0.72 0.61  CALCIUM 8.0* 7.9* 7.4*   LFT  Recent Labs  09/30/14 0520  PROT 6.3  ALBUMIN 2.1*  AST 13  ALT 6  ALKPHOS 760*  BILITOT 0.2*   PT/INR  Recent Labs  09/29/14 1728  LABPROT 15.9*  INR 1.25   Studies/Results: Dg Chest Portable 1 View  09/29/2014   CLINICAL DATA:  Wheezing, prostate cancer.  EXAM: PORTABLE CHEST - 1 VIEW  COMPARISON:  Radiograph 08/05/2014  FINDINGS: There is a port in the left chest wall  unchanged. Normal cardiac silhouette. Snow pulmonary edema or pleural fluid. No pneumothorax. Widespread densely sclerotic skeletal metastasis are noted.  IMPRESSION: 1.  No acute cardiopulmonary process. 2. Diffuse sclerotic osseous metastasis.   Electronically Signed   By: Suzy Bouchard M.D.   On: 09/29/2014 18:11    IMPRESSION:  -GI bleeding with melena/black stools:  Also has epigastric pain.  Rule out ulcer disease, AVM's, etc.  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  Bleeding seems to have stopped at this point. -Acute on chronic blood loss anemia:  Hgb down to 5.4 grams, but received to units of PRCB's with appropriated response to 7.2 grams. -Thrombocytopenia:  Platelets 77 today. -Metastatic prostate CA to bone:  On hospice care currently.  Had several treatments including radiation in the past. -COPD on home O2:  With exacerbation.  PLAN: -Continue pantoprazole 40 mg BID IV for now. -Clear liquids today. -Monitor Hgb and transfuse prn.  ? If he should be given one more unit PRBC's. -Consider endoscopic evaluation, possibly starting with EGD, once respiratory status at baseline.   Shamyah Stantz D.  09/30/2014, 10:09 AM  Pager number [phone]

## 2014-10-01 ENCOUNTER — Encounter (HOSPITAL_COMMUNITY)
Admission: EM | Disposition: A | Discharge status: Hospice | Payer: Self-pay | Source: Home / Self Care | Attending: Internal Medicine

## 2014-10-01 DIAGNOSIS — E440 Moderate protein-calorie malnutrition: Secondary | ICD-10-CM | POA: Insufficient documentation

## 2014-10-01 DIAGNOSIS — D61818 Other pancytopenia: Secondary | ICD-10-CM

## 2014-10-01 LAB — CBC
HEMATOCRIT: 23.2 % — AB (ref 39.0–52.0)
HEMOGLOBIN: 7.5 g/dL — AB (ref 13.0–17.0)
MCH: 30 pg (ref 26.0–34.0)
MCHC: 32.3 g/dL (ref 30.0–36.0)
MCV: 92.8 fL (ref 78.0–100.0)
Platelets: 71 10*3/uL — ABNORMAL LOW (ref 150–400)
RBC: 2.5 MIL/uL — ABNORMAL LOW (ref 4.22–5.81)
RDW: 20.3 % — ABNORMAL HIGH (ref 11.5–15.5)
WBC: 6.2 10*3/uL (ref 4.0–10.5)

## 2014-10-01 LAB — BASIC METABOLIC PANEL
Anion gap: 13 (ref 5–15)
BUN: 13 mg/dL (ref 6–23)
CO2: 23 mEq/L (ref 19–32)
CREATININE: 0.49 mg/dL — AB (ref 0.50–1.35)
Calcium: 7 mg/dL — ABNORMAL LOW (ref 8.4–10.5)
Chloride: 103 mEq/L (ref 96–112)
GFR calc Af Amer: >90 mL/min (ref >90)
GLUCOSE: 129 mg/dL — AB (ref 70–99)
POTASSIUM: 4 meq/L (ref 3.7–5.3)
Sodium: 139 mEq/L (ref 137–147)

## 2014-10-01 LAB — PREPARE RBC (CROSSMATCH)

## 2014-10-01 LAB — GLUCOSE, CAPILLARY: Glucose-Capillary: 132 mg/dL — ABNORMAL HIGH (ref 70–99)

## 2014-10-01 SURGERY — EGD (ESOPHAGOGASTRODUODENOSCOPY)
Anesthesia: Moderate Sedation

## 2014-10-01 MED ORDER — IPRATROPIUM-ALBUTEROL 0.5-2.5 (3) MG/3ML IN SOLN
3.0000 mL | Freq: Four times a day (QID) | RESPIRATORY_TRACT | Status: DC
  Administered 2014-10-02 (×2): 3 mL via RESPIRATORY_TRACT
  Filled 2014-10-01 (×2): qty 3

## 2014-10-01 MED ORDER — IPRATROPIUM-ALBUTEROL 0.5-2.5 (3) MG/3ML IN SOLN: 3.0000 mL | Freq: Two times a day (BID) | RESPIRATORY_TRACT | Status: DC

## 2014-10-01 NOTE — Progress Notes (Signed)
Full Code.  Related admission.  Patient was admitted to hospice services on 06/26/14 for prostate cancer.  Patient is awake and verbal.  Confused about the date.  Reports mild discomfort to his abdomen.  Scheduled for endoscopy tomorrow.  No family present at the bedside.  Please contact [place] with any questions or concerns.  Vance Gather, RN [facility]

## 2014-10-01 NOTE — Progress Notes (Signed)
Patient respiratory protocol done and patient scored a 10. Patient states he takes treatments at home twice a day, but family records say every six hours. Patient states he doesn't wake up to take treatments unless needed. Will not make any changes at this time. BBS rhonchi with underlining exp wheezes throughout.

## 2014-10-01 NOTE — Progress Notes (Signed)
INITIAL NUTRITION ASSESSMENT  DOCUMENTATION CODES Per approved criteria  -Non-severe (moderate) malnutrition in the context of chronic illness  Pt meets criteria for moderate MALNUTRITION in the context of chronic illness as evidenced by moderate muscle and subcutaneous fat depletion and 27% wt loss x 7 months.  INTERVENTION: Provide Ensure Complete po TID, each supplement provides 350 kcal and 13 grams of protein Encourage PO intake RD to continue to monitor  NUTRITION DIAGNOSIS: Increased nutrient (protein) needs related to prostate cancer as evidenced by loss of muscle mass.   Goal: Pt to meet >/= 90% of their estimated nutrition needs   Monitor:  PO and supplemental intake, weight, labs, I/O's  Reason for Assessment: Pt identified as at nutrition risk on the Malnutrition Screen Tool  Admitting Dx: GI bleed  ASSESSMENT: 68 y.o. male with a PMH of stage IV prostate cancer/bone metastasis status post radiation therapy/androgen operation therapy, as well as chemotherapy with Taxotere, now under hospice care who was admitted on 09/29/14 with a chief complaint of melena.   Pt reports UBW of 220 lb, weight gain of "a few lb". Wt loss documented states pt has lost a total of 60 lb x 7 months (27% wt loss x 7 months, this is significant for time frame). Pt has recently gained 7 lb, per weight history documentation.   Pt states that he has poor appetite, drinks 2 Boosts daily at home. Pt would like to receive Ensure supplements during his stay. RD will order TID.  Nutrition Focused Physical Exam:  Subcutaneous Fat:  Orbital Region: WNL Upper Arm Region: moderate depletion Thoracic and Lumbar Region: NA  Muscle:  Temple Region: mild depletion Clavicle Bone Region: moderate depletion Clavicle and Acromion Bone Region: moderate depletion Scapular Bone Region: moderate depletion Dorsal Hand: moderate depletion Patellar Region: NA Anterior Thigh Region: NA Posterior Calf Region:  NA  Edema: no LE edema   Labs reviewed: Low Creatinine Glucose 129  Height: Ht Readings from Last 1 Encounters:  09/29/14 5\' 10"  (1.778 m)    Weight: Wt Readings from Last 1 Encounters:  09/30/14 143 lb 14.4 oz (65.273 kg)    Ideal Body Weight: 166 lb  % Ideal Body Weight: 86%  Wt Readings from Last 10 Encounters:  09/30/14 143 lb 14.4 oz (65.273 kg)  09/29/14 145 lb 1.6 oz (65.817 kg)  08/09/14 135 lb 3.2 oz (61.326 kg)  08/05/14 136 lb 14.5 oz (62.1 kg)    Usual Body Weight: 220 lb -per pt  % Usual Body Weight: 65%  BMI:  Body mass index is 20.65 kg/(m^2).  Estimated Nutritional Needs: Kcal: 1950-2150 Protein: 90-100g Fluid: 2 L/day  Skin: intact  Diet Order: Diet full liquid Diet NPO time specified at midnight  EDUCATION NEEDS: -No education needs identified at this time   Intake/Output Summary (Last 24 hours) at 10/01/14 1409 Last data filed at 10/01/14 0600  Gross per 24 hour  Intake    360 ml  Output   2100 ml  Net  -1740 ml    Last BM: 11/7  Labs:   Recent Labs Lab 09/29/14 1900 09/30/14 0520 10/01/14 0410  NA 143 138 139  K 3.8 3.3* 4.0  CL 104 102 103  CO2 23 23 23   BUN 28* 22 13  CREATININE 0.72 0.61 0.49*  CALCIUM 7.9* 7.4* 7.0*  MG 2.9*  --   --   PHOS 2.5  --   --   GLUCOSE 113* 83 129*    CBG (last 3)  Recent Labs  09/30/14 0743 10/01/14 0733  GLUCAP 84 132*    Scheduled Meds: . ALPRAZolam  0.5 mg Oral TID  . azithromycin  250 mg Oral Daily  . cholecalciferol  1,000 Units Oral Daily  . dextromethorphan  60 mg Oral BID  . guaiFENesin  600 mg Oral BID  . ipratropium-albuterol  3 mL Nebulization Q6H  . methadone  15 mg Oral 3 times per day  . methylPREDNISolone (SOLU-MEDROL) injection  40 mg Intravenous 4 times per day  . pantoprazole (PROTONIX) IV  40 mg Intravenous Q12H  . senna-docusate  1 tablet Oral BID  . sodium chloride  3 mL Intravenous Q12H    Continuous Infusions: . 0.9 % NaCl with KCl 20 mEq  / L 75 mL/hr at 10/01/14 1321    Past Medical History  Diagnosis Date  . COPD (chronic obstructive pulmonary disease)   . Asthma   . Cancer     prostate with mets  . Anemia     Past Surgical History  Procedure Laterality Date  . Hip surgery  April 2015    "Pin put in as preventative"    Clayton Bibles, MS, RD, LDN Pager: [phone] After Hours Pager: [phone]

## 2014-10-01 NOTE — Progress Notes (Signed)
Progress Note   Gregory [name] OHY:[id number] DOB: Sep 27, 1946 DOA: 09/29/2014 PCP: No PCP Per Patient   Brief Narrative:   Gregory [name] is an 68 y.o. male with a PMH of stage IV prostate cancer/bone metastasis status post radiation therapy/androgen operation therapy, as well as chemotherapy with Taxotere, now under hospice care who was admitted on 09/29/14 with a chief complaint of melena. He was admitted from the cancer center after his hemoglobin was found to be 5.4 and platelet count 92.  Assessment/Plan:   Principal Problem:   GI bleed with acute blood loss anemia in the setting of chronic anemia  Hemoglobin 5.4--->7.2 status post 2 units of PRBCs.  Hemoglobin 7.5 this a.m.  GI consultation performed 09/30/14 with plans for an EGD when respiratory status stable.  Continue bowel rest and empiric PPI therapy.  Transfuse as needed to maintain hemoglobin greater than 7.  Active Problems:   COPD exacerbation  Continue Duonebs.  Continue Solu-Medrol and empiric azithromycin.    Continue Mucinex/Delsym for cough.    Pancytopenia / thrombocytopenia  Likely reflective from bone marrow involvement of prostate cancer.  Blood counts stable.    Hypokalemia  Corrected with potassium added to IV fluids.    Prostate cancer metastatic to bone / Elevated alkaline phosphatase  Marked elevation of alkaline phosphatase consistent with bony metastasis.  Continue steroids, pain control with methadone and MSIR.  Patient under hospice care after progression of disease status post Taxotere chemotherapy.    DVT Prophylaxis  Continue SCDs.  Code Status: Full. Family Communication: No family at the bedside. Disposition Plan: Home when stable.   IV Access:    Peripheral IV   Procedures and diagnostic studies:   Dg Chest Portable 1 View 09/29/2014: 1.  No acute cardiopulmonary process. 2. Diffuse sclerotic osseous metastasis.       Medical Consultants:    Dr. Zenovia Jarred,  GI.  Anti-Infectives:    Azithromycin 09/30/14--->  Subjective:    Gregory [name] had a black stool overnight but no nausea or vomiting.  He is a but upset that he was unable to get to the bathroom in time, and that his "feet were tied" so he soiled the bed.   Says his breathing is much better.  No cough.  Objective:    Filed Vitals:   09/30/14 2025 09/30/14 2041 10/01/14 0240 10/01/14 0429  BP: 122/72   108/52  Pulse: 78   79  Temp: 98.6 F (37 C)   98.6 F (37 C)  TempSrc: Oral   Oral  Resp: 16   16  Height:      Weight:      SpO2: 98% 100% 98% 100%    Intake/Output Summary (Last 24 hours) at 10/01/14 0723 Last data filed at 10/01/14 0600  Gross per 24 hour  Intake    360 ml  Output   2100 ml  Net  -1740 ml    Exam: Gen:  NAD Cardiovascular:  RRR, No M/R/G Respiratory:  Lungs with reduced wheeze Gastrointestinal:  Abdomen softly distended, + BS Extremities:  Trace edema, SCDs on   Data Reviewed:    Labs: Basic Metabolic Panel:  Recent Labs Lab 09/29/14 1538  09/29/14 1900 09/30/14 0520 10/01/14 0410  NA 137  --  143 138 139  K 3.5  < > 3.8 3.3* 4.0  CL  --   --  104 102 103  CO2 22  --  23 23 23   GLUCOSE 133  --  113* 83 129*  BUN 27.3*  --  28* 22 13  CREATININE 0.8  --  0.72 0.61 0.49*  CALCIUM 8.0*  --  7.9* 7.4* 7.0*  MG  --   --  2.9*  --   --   PHOS  --   --  2.5  --   --   < > = values in this interval not displayed. GFR Estimated Creatinine Clearance: 79.4 mL/min (by C-G formula based on Cr of 0.49). Liver Function Tests:  Recent Labs Lab 09/29/14 1538 09/29/14 1900 09/30/14 0520  AST 14 13 13   ALT 10 7 6   ALKPHOS 872* 832* 760*  BILITOT <0.20 <0.2* 0.2*  PROT 7.0 7.1 6.3  ALBUMIN 2.4* 2.4* 2.1*   Coagulation profile  Recent Labs Lab 09/29/14 1728  INR 1.25    CBC:  Recent Labs Lab 09/29/14 1538 09/29/14 1900 09/30/14 0520 10/01/14 0410  WBC 4.9 4.3 3.8* 6.2  NEUTROABS 3.3 2.7  --   --   HGB 5.7* 5.4* 7.2*  7.5*  HCT 19.0* 17.7* 22.1* 23.2*  MCV 96.0 97.8 92.9 92.8  PLT 120* 92* 77* 71*   BNP (last 3 results)  Recent Labs  08/05/14 1239  PROBNP 878.3*   Thyroid function studies:  Recent Labs  09/29/14 1900  TSH 0.741   Anemia work up:  Recent Labs  09/29/14 1728  VITAMINB12 450  FOLATE 11.5  FERRITIN 2627*  TIBC 170*  IRON 96  RETICCTPCT 1.9    Microbiology Recent Results (from the past 240 hour(s))  TECHNOLOGIST REVIEW     Status: None   Collection Time: 09/29/14  3:38 PM  Result Value Ref Range Status   Technologist Review Metas and Myelocytes present, rouleaux present  Final     Medications:   . ALPRAZolam  0.5 mg Oral TID  . azithromycin  250 mg Oral Daily  . cholecalciferol  1,000 Units Oral Daily  . dextromethorphan  60 mg Oral BID  . guaiFENesin  600 mg Oral BID  . ipratropium-albuterol  3 mL Nebulization Q6H  . methadone  15 mg Oral 3 times per day  . methylPREDNISolone (SOLU-MEDROL) injection  40 mg Intravenous 4 times per day  . pantoprazole (PROTONIX) IV  40 mg Intravenous Q12H  . senna-docusate  1 tablet Oral BID  . sodium chloride  3 mL Intravenous Q12H   Continuous Infusions: . 0.9 % NaCl with KCl 20 mEq / L 75 mL/hr at 09/30/14 2356    Time spent: 25 minutes.    LOS: 2 days   Gregory [name]  [facility] Pager [phone]. If unable to reach me by pager, please call my cell phone at [phone].  *Please refer to [email or web address], password [email or web address] to get updated schedule on who will round on this patient, as hospitalists switch teams weekly. If 7PM-7AM, please contact night-coverage at [email or web address], password [email or web address] for any overnight needs.  10/01/2014, 7:23 AM

## 2014-10-01 NOTE — Progress Notes (Signed)
     Gregory [name] [facility]astroenterology Progress Note  Subjective:  Lungs still with a lot of wheezing but much improved from yesterday.  No further black stools.  Still reports some epigastric pain.  Objective:  Vital signs in last 24 hours: Temp:  [98.6 F (37 C)] 98.6 F (37 C) (11/08 0429) Pulse Rate:  [78-108] 79 (11/08 0429) Resp:  [16-18] 16 (11/08 0429) BP: (108-153)/(52-72) 108/52 mmHg (11/08 0429) SpO2:  [98 %-100 %] 100 % (11/08 0754) Last BM Date: 09/30/14 General:  Alert, chronically ill-appearing, in NAD Heart:  Regular rate and rhythm; no murmurs Pulm:  Expiratory wheezing noted B/L Abdomen:  Soft, non-distended.  BS present.  Mild epigastric/mid-abdominal TTP without R/R/G. Extremities:  Without edema. Neurologic:  Alert and  oriented x4; grossly normal neurologically. Psych:  Alert and cooperative. Normal mood and affect.  Intake/Output from previous day: 11/07 0701 - 11/08 0700 In: 360 [P.O.:360] Out: 2100 [Urine:2100]  Lab Results:  Recent Labs  09/29/14 1900 09/30/14 0520 10/01/14 0410  WBC 4.3 3.8* 6.2  HGB 5.4* 7.2* 7.5*  HCT 17.7* 22.1* 23.2*  PLT 92* 77* 71*   BMET  Recent Labs  09/29/14 1900 09/30/14 0520 10/01/14 0410  NA 143 138 139  K 3.8 3.3* 4.0  CL 104 102 103  CO2 23 23 23   GLUCOSE 113* 83 129*  BUN 28* 22 13  CREATININE 0.72 0.61 0.49*  CALCIUM 7.9* 7.4* 7.0*   LFT  Recent Labs  09/30/14 0520  PROT 6.3  ALBUMIN 2.1*  AST 13  ALT 6  ALKPHOS 760*  BILITOT 0.2*   PT/INR  Recent Labs  09/29/14 1728  LABPROT 15.9*  INR 1.25   Dg Chest Portable 1 View  09/29/2014   CLINICAL DATA:  Wheezing, prostate cancer.  EXAM: PORTABLE CHEST - 1 VIEW  COMPARISON:  Radiograph 08/05/2014  FINDINGS: There is a port in the left chest wall unchanged. Normal cardiac silhouette. Snow pulmonary edema or pleural fluid. No pneumothorax. Widespread densely sclerotic skeletal metastasis are noted.  IMPRESSION: 1.  No acute cardiopulmonary  process. 2. Diffuse sclerotic osseous metastasis.   Electronically Signed   By: Suzy Bouchard M.D.   On: 09/29/2014 18:11   Assessment / Plan: -GI bleeding with melena/black stools: Also has epigastric pain. Rule out ulcer disease, AVM's, etc. 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 Bleeding seems to have stopped at this point. -Acute on chronic blood loss anemia: Hgb down to 5.4 grams, but received to units of PRCB's with appropriate response and Hgb stable this AM at 7.5 grams. -Thrombocytopenia: Platelets 71 today. -Metastatic prostate CA to bone: On hospice care currently. Had several treatments including radiation in the past. -COPD on home O2: With exacerbation.  *Continue pantoprazole 40 mg BID IV for now. *Monitor Hgb and further transfuse prn. *EGD 11/9 due to the fact that he will likely need MAC sedation with his pain medication use at home.  Will allow full liquids today.    LOS: 2 days   Ziana Heyliger D.  10/01/2014, 8:44 AM  Pager number [phone]

## 2014-10-02 ENCOUNTER — Encounter (HOSPITAL_COMMUNITY)
Admission: EM | Disposition: A | Discharge status: Hospice | Payer: Self-pay | Source: Home / Self Care | Attending: Internal Medicine

## 2014-10-02 ENCOUNTER — Encounter (HOSPITAL_COMMUNITY): Payer: Self-pay | Admitting: *Deleted

## 2014-10-02 DIAGNOSIS — B3781 Candidal esophagitis: Secondary | ICD-10-CM | POA: Diagnosis present

## 2014-10-02 DIAGNOSIS — K2950 Unspecified chronic gastritis without bleeding: Secondary | ICD-10-CM | POA: Diagnosis present

## 2014-10-02 HISTORY — PX: ESOPHAGOGASTRODUODENOSCOPY: SHX5428

## 2014-10-02 LAB — CBC
HEMATOCRIT: 23.1 % — AB (ref 39.0–52.0)
HEMOGLOBIN: 7.3 g/dL — AB (ref 13.0–17.0)
MCH: 30.2 pg (ref 26.0–34.0)
MCHC: 31.6 g/dL (ref 30.0–36.0)
MCV: 95.5 fL (ref 78.0–100.0)
Platelets: 65 10*3/uL — ABNORMAL LOW (ref 150–400)
RBC: 2.42 MIL/uL — ABNORMAL LOW (ref 4.22–5.81)
RDW: 19.9 % — AB (ref 11.5–15.5)
WBC: 7.2 10*3/uL (ref 4.0–10.5)

## 2014-10-02 LAB — BASIC METABOLIC PANEL
Anion gap: 13 (ref 5–15)
BUN: 10 mg/dL (ref 6–23)
CHLORIDE: 103 meq/L (ref 96–112)
CO2: 21 mEq/L (ref 19–32)
CREATININE: 0.5 mg/dL (ref 0.50–1.35)
Calcium: 7.4 mg/dL — ABNORMAL LOW (ref 8.4–10.5)
GFR calc non Af Amer: >90 mL/min (ref >90)
GLUCOSE: 128 mg/dL — AB (ref 70–99)
Potassium: 4.4 mEq/L (ref 3.7–5.3)
Sodium: 137 mEq/L (ref 137–147)

## 2014-10-02 LAB — GLUCOSE, CAPILLARY: Glucose-Capillary: 125 mg/dL — ABNORMAL HIGH (ref 70–99)

## 2014-10-02 SURGERY — ESOPHAGOGASTRODUODENOSCOPY (EGD) WITH PROPOFOL
Anesthesia: Monitor Anesthesia Care

## 2014-10-02 SURGERY — EGD (ESOPHAGOGASTRODUODENOSCOPY)
Anesthesia: Moderate Sedation

## 2014-10-02 SURGERY — EGD (ESOPHAGOGASTRODUODENOSCOPY)
Anesthesia: Monitor Anesthesia Care

## 2014-10-02 MED ORDER — SODIUM CHLORIDE 0.9 % IV SOLN
INTRAVENOUS | Status: DC
  Administered 2014-10-02: 500 mL via INTRAVENOUS

## 2014-10-02 MED ORDER — FENTANYL CITRATE 0.05 MG/ML IJ SOLN
INTRAMUSCULAR | Status: AC
  Filled 2014-10-02: qty 2

## 2014-10-02 MED ORDER — MIDAZOLAM HCL 10 MG/2ML IJ SOLN
INTRAMUSCULAR | Status: DC | PRN
  Administered 2014-10-02 (×2): 2 mg via INTRAVENOUS

## 2014-10-02 MED ORDER — FENTANYL CITRATE 0.05 MG/ML IJ SOLN
INTRAMUSCULAR | Status: DC | PRN
  Administered 2014-10-02 (×2): 25 ug via INTRAVENOUS

## 2014-10-02 MED ORDER — MIDAZOLAM HCL 10 MG/2ML IJ SOLN
INTRAMUSCULAR | Status: AC
  Filled 2014-10-02: qty 2

## 2014-10-02 MED ORDER — IPRATROPIUM-ALBUTEROL 0.5-2.5 (3) MG/3ML IN SOLN
3.0000 mL | Freq: Three times a day (TID) | RESPIRATORY_TRACT | Status: DC
  Administered 2014-10-02 – 2014-10-03 (×3): 3 mL via RESPIRATORY_TRACT
  Filled 2014-10-02 (×3): qty 3

## 2014-10-02 MED ORDER — FLUCONAZOLE 200 MG PO TABS
200.0000 mg | ORAL_TABLET | Freq: Every day | ORAL | Status: DC
  Administered 2014-10-02 – 2014-10-03 (×2): 200 mg via ORAL
  Filled 2014-10-02 (×5): qty 1

## 2014-10-02 SURGICAL SUPPLY — 14 items

## 2014-10-02 NOTE — Interval H&P Note (Signed)
History and Physical Interval Note:  10/02/2014 2:18 PM  Gregory [name]  has presented today for surgery, with the diagnosis of gib  The various methods of treatment have been discussed with the patient and family. After consideration of risks, benefits and other options for treatment, the patient has consented to  Procedure(s): ESOPHAGOGASTRODUODENOSCOPY (EGD) (N/A) as a surgical intervention .  The patient's history has been reviewed, patient examined, no change in status, stable for surgery.  I have reviewed the patient's chart and labs.  Questions were answered to the patient's satisfaction.     Jadan Hinojos D

## 2014-10-02 NOTE — Plan of Care (Signed)
Problem: Phase II Progression Outcomes Goal: Vital signs remain stable Outcome: Progressing     

## 2014-10-02 NOTE — H&P (View-Only) (Signed)
Progress Note   Gregory [name] YFV:[id number] DOB: 1946/09/19 DOA: 09/29/2014 PCP: No PCP Per Patient   Brief Narrative:   Gregory [name] is an 68 y.o. male with a PMH of stage IV prostate cancer/bone metastasis status post radiation therapy/androgen operation therapy, as well as chemotherapy with Taxotere, now under hospice care who was admitted on 09/29/14 with a chief complaint of melena. He was admitted from the cancer center after his hemoglobin was found to be 5.4 and platelet count 92.  GI has been consulted with plans for EGD 10/02/14.  Assessment/Plan:   Principal Problem:   GI bleed with acute blood loss anemia in the setting of chronic anemia  Hemoglobin 5.4--->7.2 status post 2 units of PRBCs.  Hemoglobin 7.3 this a.m.  GI consultation performed 09/30/14 with plans for an EGD today.  Continue bowel rest and empiric PPI therapy.  Transfuse as needed to maintain hemoglobin greater than 7.  Active Problems:   COPD exacerbation  Continue Duonebs.  Continue Solu-Medrol and empiric azithromycin.  No weaning of steroids yet due to ongoing bronchospasm.  Continue Mucinex/Delsym for cough.    Pancytopenia / thrombocytopenia  Likely reflective from bone marrow involvement of prostate cancer.  Blood counts stable.    Moderate malnutrition  Pt meets criteria for moderate MALNUTRITION in the context of chronic illness as evidenced by moderate muscle and subcutaneous fat depletion and 27% wt loss x 7 months.Seen by dietician 10/01/14.    Evaluated by dietician 10/01/14.  Continue supplements.    Hypokalemia  Corrected with potassium added to IV fluids.    Prostate cancer metastatic to bone / Elevated alkaline phosphatase  Marked elevation of alkaline phosphatase consistent with bony metastasis.  Continue steroids, pain control with methadone and MSIR.  Patient under hospice care after progression of disease status post Taxotere chemotherapy.    DVT  Prophylaxis  Continue SCDs.  Code Status: Full. Family Communication: No family at the bedside. Disposition Plan: Home when stable.   IV Access:    Peripheral IV   Procedures and diagnostic studies:   Dg Chest Portable 1 View 09/29/2014: 1.  No acute cardiopulmonary process. 2. Diffuse sclerotic osseous metastasis.       Medical Consultants:    Dr. Zenovia Jarred, GI.  Anti-Infectives:    Azithromycin 09/30/14--->  Subjective:   Gregory [name] has a cough and still is a bit wheezy.  Bowels moved last night, reports stool as dark appearing.  Still a bit dyspneic.  Says his "throat is dry".    Objective:    Filed Vitals:   10/01/14 2107 10/02/14 0233 10/02/14 0441 10/02/14 0510  BP: 124/57  124/53   Pulse: 82  85   Temp: 98.5 F (36.9 C)  98.8 F (37.1 C)   TempSrc: Oral  Oral   Resp: 18  16   Height:      Weight:    64.955 kg (143 lb 3.2 oz)  SpO2: 100% 100% 100%     Intake/Output Summary (Last 24 hours) at 10/02/14 0718 Last data filed at 10/02/14 0443  Gross per 24 hour  Intake    480 ml  Output   1300 ml  Net   -820 ml    Exam: Gen:  NAD Cardiovascular:  RRR, No M/R/G Respiratory:  Lungs with diffuse expiratory wheeze Gastrointestinal:  Abdomen softly distended, + BS Extremities:  Trace edema, SCDs on   Data Reviewed:    Labs: Basic Metabolic Panel:  Recent Labs Lab 09/29/14 1538  09/29/14 1900 09/30/14 0520 10/01/14 0410 10/02/14 0412  NA 137  --  143 138 139 137  K 3.5  < > 3.8 3.3* 4.0 4.4  CL  --   --  104 102 103 103  CO2 22  --  23 23 23 21   GLUCOSE 133  --  113* 83 129* 128*  BUN 27.3*  --  28* 22 13 10   CREATININE 0.8  --  0.72 0.61 0.49* 0.50  CALCIUM 8.0*  --  7.9* 7.4* 7.0* 7.4*  MG  --   --  2.9*  --   --   --   PHOS  --   --  2.5  --   --   --   < > = values in this interval not displayed. GFR Estimated Creatinine Clearance: 79 mL/min (by C-G formula based on Cr of 0.5). Liver Function Tests:  Recent Labs Lab  09/29/14 1538 09/29/14 1900 09/30/14 0520  AST 14 13 13   ALT 10 7 6   ALKPHOS 872* 832* 760*  BILITOT <0.20 <0.2* 0.2*  PROT 7.0 7.1 6.3  ALBUMIN 2.4* 2.4* 2.1*   Coagulation profile  Recent Labs Lab 09/29/14 1728  INR 1.25    CBC:  Recent Labs Lab 09/29/14 1538 09/29/14 1900 09/30/14 0520 10/01/14 0410 10/02/14 0412  WBC 4.9 4.3 3.8* 6.2 7.2  NEUTROABS 3.3 2.7  --   --   --   HGB 5.7* 5.4* 7.2* 7.5* 7.3*  HCT 19.0* 17.7* 22.1* 23.2* 23.1*  MCV 96.0 97.8 92.9 92.8 95.5  PLT 120* 92* 77* 71* 65*   BNP (last 3 results)  Recent Labs  08/05/14 1239  PROBNP 878.3*   Thyroid function studies:  Recent Labs  09/29/14 1900  TSH 0.741   Anemia work up:  Recent Labs  09/29/14 1728  VITAMINB12 450  FOLATE 11.5  FERRITIN 2627*  TIBC 170*  IRON 96  RETICCTPCT 1.9    Microbiology Recent Results (from the past 240 hour(s))  TECHNOLOGIST REVIEW     Status: None   Collection Time: 09/29/14  3:38 PM  Result Value Ref Range Status   Technologist Review Metas and Myelocytes present, rouleaux present  Final     Medications:   . ALPRAZolam  0.5 mg Oral TID  . azithromycin  250 mg Oral Daily  . cholecalciferol  1,000 Units Oral Daily  . dextromethorphan  60 mg Oral BID  . guaiFENesin  600 mg Oral BID  . ipratropium-albuterol  3 mL Nebulization Q6H  . methadone  15 mg Oral 3 times per day  . methylPREDNISolone (SOLU-MEDROL) injection  40 mg Intravenous 4 times per day  . pantoprazole (PROTONIX) IV  40 mg Intravenous Q12H  . senna-docusate  1 tablet Oral BID  . sodium chloride  3 mL Intravenous Q12H   Continuous Infusions: . 0.9 % NaCl with KCl 20 mEq / L 75 mL/hr at 10/02/14 0239    Time spent: 25 minutes.    LOS: 3 days   [facility] Pager [phone]. If unable to reach me by pager, please call my cell phone at [phone].  *Please refer to [email or web address], password [email or web address] to get updated schedule on who will round on this patient, as  hospitalists switch teams weekly. If 7PM-7AM, please contact night-coverage at [email or web address], password [email or web address] for any overnight needs.  10/02/2014, 7:18 AM

## 2014-10-02 NOTE — Progress Notes (Signed)
Pt waiting to be taken to have a procedure to try and determine cause of GI bleed.  When ask what pt was told by oncologist at Friday's appointment, pt says that he was going to be hospitalized for low blood count and to determine GI bleed and after this is done, then physician will address the cancer.  Spoke with pt's sister, Dianah Field via phone.  Dianah Field says that pt can return to her home once he is discharged from the hospital.  Ollen Gross, [place] ext: [phone]

## 2014-10-02 NOTE — Op Note (Signed)
[facility] [place], [address]   ENDOSCOPY PROCEDURE REPORT  PATIENT: Gregory, [name]  MR#: [id number] BIRTHDATE: 03/23/1946 , 24  yrs. old GENDER: male ENDOSCOPIST:Armstead Heiland Benson [place], MD REFERRED BY: PROCEDURE DATE:  10/26/14 PROCEDURE:   EGD, diagnostic ASA CLASS:    Class III INDICATIONS: melena and heme positive stool. MEDICATION: Fentanyl 50 mcg IV and Versed 4 mg IV TOPICAL ANESTHETIC:   Cetacaine Spray  DESCRIPTION OF PROCEDURE:   After the risks and benefits of the procedure were explained, informed consent was obtained.  The [place] [id number]  endoscope was introduced through the mouth  and advanced to the second portion of the duodenum .  The instrument was slowly withdrawn as the mucosa was fully examined.  FINDINGS: A moderately severe Candidal esophagitis was identified. The Z-line was normal.  The gastric antrum revealed a moderate gastritis.  No evidence of any active bleeding.  No evidence of any polyps, masses, inflammation, ulcerations, erosions, or vascular abnormalities in the duodenum.    Retroflexed views revealed no abnormalities.    The scope was then withdrawn from the patient and the procedure completed.  COMPLICATIONS: There were no immediate complications.  ENDOSCOPIC IMPRESSION: 1) Candidal esophagitis. 2) Antral gastritis - nonbleeding. RECOMMENDATIONS: 1) Continue with PPI.  He can be switched to oral. 2) Fluconazole x 10 days. 3) Follow HGB and transfuse as necessary.  With his metastatic disease and enrollment in Hospice, I am reluctant to pursue a colonoscopy. _______________________________ eSigned:  Carol Ada, MD 2014/10/26 3:45 PM    cc: CPT CODES: ICD CODES:  The ICD and CPT codes recommended by this software are interpretations from the data that the clinical staff has captured with the software.  The verification of the translation of this report to the ICD and CPT codes and modifiers  is the sole responsibility of the health care institution and practicing physician where this report was generated.  [employer] will not be held responsible for the validity of the ICD and CPT codes included on this report.  AMA assumes no liability for data contained or not contained herein. CPT is a Designer, television/film set of the [employer].

## 2014-10-02 NOTE — Progress Notes (Addendum)
Progress Note   Gregory [name] BWL:[id number] DOB: 10-Jun-1946 DOA: 09/29/2014 PCP: No PCP Per Patient   Brief Narrative:   Gregory [name] is an 68 y.o. male with a PMH of stage IV prostate cancer/bone metastasis status post radiation therapy/androgen operation therapy, as well as chemotherapy with Taxotere, now under hospice care who was admitted on 09/29/14 with a chief complaint of melena. He was admitted from the cancer center after his hemoglobin was found to be 5.4 and platelet count 92.  GI has been consulted with plans for EGD 10/02/14.  Assessment/Plan:   Principal Problem:   GI bleed with acute blood loss anemia in the setting of chronic anemia  Hemoglobin 5.4--->7.2 status post 2 units of PRBCs.  Hemoglobin 7.3 this a.m.  GI consultation performed 09/30/14 with plans for an EGD today.  Continue bowel rest and empiric PPI therapy.  Transfuse as needed to maintain hemoglobin greater than 7.  Active Problems:   COPD exacerbation  Continue Duonebs.  Continue Solu-Medrol and empiric azithromycin.  No weaning of steroids yet due to ongoing bronchospasm.  Continue Mucinex/Delsym for cough.    Pancytopenia / thrombocytopenia  Likely reflective from bone marrow involvement of prostate cancer.  Blood counts stable.    Moderate malnutrition  Pt meets criteria for moderate MALNUTRITION in the context of chronic illness as evidenced by moderate muscle and subcutaneous fat depletion and 27% wt loss x 7 months.Seen by dietician 10/01/14.    Evaluated by dietician 10/01/14.  Continue supplements.    Hypokalemia  Corrected with potassium added to IV fluids.    Prostate cancer metastatic to bone / Elevated alkaline phosphatase  Marked elevation of alkaline phosphatase consistent with bony metastasis.  Continue steroids, pain control with methadone and MSIR.  Patient under hospice care after progression of disease status post Taxotere chemotherapy.    DVT  Prophylaxis  Continue SCDs.  Code Status: Full. Family Communication: No family at the bedside. Disposition Plan: Home when stable.   IV Access:    Peripheral IV   Procedures and diagnostic studies:   Dg Chest Portable 1 View 09/29/2014: 1.  No acute cardiopulmonary process. 2. Diffuse sclerotic osseous metastasis.       Medical Consultants:    Dr. Zenovia Jarred, GI.  Anti-Infectives:    Azithromycin 09/30/14--->  Subjective:   Gregory [name] has a cough and still is a bit wheezy.  Bowels moved last night, reports stool as dark appearing.  Still a bit dyspneic.  Says his "throat is dry".    Objective:    Filed Vitals:   10/01/14 2107 10/02/14 0233 10/02/14 0441 10/02/14 0510  BP: 124/57  124/53   Pulse: 82  85   Temp: 98.5 F (36.9 C)  98.8 F (37.1 C)   TempSrc: Oral  Oral   Resp: 18  16   Height:      Weight:    64.955 kg (143 lb 3.2 oz)  SpO2: 100% 100% 100%     Intake/Output Summary (Last 24 hours) at 10/02/14 0718 Last data filed at 10/02/14 0443  Gross per 24 hour  Intake    480 ml  Output   1300 ml  Net   -820 ml    Exam: Gen:  NAD Cardiovascular:  RRR, No M/R/G Respiratory:  Lungs with diffuse expiratory wheeze Gastrointestinal:  Abdomen softly distended, + BS Extremities:  Trace edema, SCDs on   Data Reviewed:    Labs: Basic Metabolic Panel:  Recent Labs Lab 09/29/14 1538  09/29/14 1900 09/30/14 0520 10/01/14 0410 10/02/14 0412  NA 137  --  143 138 139 137  K 3.5  < > 3.8 3.3* 4.0 4.4  CL  --   --  104 102 103 103  CO2 22  --  23 23 23 21   GLUCOSE 133  --  113* 83 129* 128*  BUN 27.3*  --  28* 22 13 10   CREATININE 0.8  --  0.72 0.61 0.49* 0.50  CALCIUM 8.0*  --  7.9* 7.4* 7.0* 7.4*  MG  --   --  2.9*  --   --   --   PHOS  --   --  2.5  --   --   --   < > = values in this interval not displayed. GFR Estimated Creatinine Clearance: 79 mL/min (by C-G formula based on Cr of 0.5). Liver Function Tests:  Recent Labs Lab  09/29/14 1538 09/29/14 1900 09/30/14 0520  AST 14 13 13   ALT 10 7 6   ALKPHOS 872* 832* 760*  BILITOT <0.20 <0.2* 0.2*  PROT 7.0 7.1 6.3  ALBUMIN 2.4* 2.4* 2.1*   Coagulation profile  Recent Labs Lab 09/29/14 1728  INR 1.25    CBC:  Recent Labs Lab 09/29/14 1538 09/29/14 1900 09/30/14 0520 10/01/14 0410 10/02/14 0412  WBC 4.9 4.3 3.8* 6.2 7.2  NEUTROABS 3.3 2.7  --   --   --   HGB 5.7* 5.4* 7.2* 7.5* 7.3*  HCT 19.0* 17.7* 22.1* 23.2* 23.1*  MCV 96.0 97.8 92.9 92.8 95.5  PLT 120* 92* 77* 71* 65*   BNP (last 3 results)  Recent Labs  08/05/14 1239  PROBNP 878.3*   Thyroid function studies:  Recent Labs  09/29/14 1900  TSH 0.741   Anemia work up:  Recent Labs  09/29/14 1728  VITAMINB12 450  FOLATE 11.5  FERRITIN 2627*  TIBC 170*  IRON 96  RETICCTPCT 1.9    Microbiology Recent Results (from the past 240 hour(s))  TECHNOLOGIST REVIEW     Status: None   Collection Time: 09/29/14  3:38 PM  Result Value Ref Range Status   Technologist Review Metas and Myelocytes present, rouleaux present  Final     Medications:   . ALPRAZolam  0.5 mg Oral TID  . azithromycin  250 mg Oral Daily  . cholecalciferol  1,000 Units Oral Daily  . dextromethorphan  60 mg Oral BID  . guaiFENesin  600 mg Oral BID  . ipratropium-albuterol  3 mL Nebulization Q6H  . methadone  15 mg Oral 3 times per day  . methylPREDNISolone (SOLU-MEDROL) injection  40 mg Intravenous 4 times per day  . pantoprazole (PROTONIX) IV  40 mg Intravenous Q12H  . senna-docusate  1 tablet Oral BID  . sodium chloride  3 mL Intravenous Q12H   Continuous Infusions: . 0.9 % NaCl with KCl 20 mEq / L 75 mL/hr at 10/02/14 0239    Time spent: 25 minutes.    LOS: 3 days   [facility] Pager [phone]. If unable to reach me by pager, please call my cell phone at [phone].  *Please refer to [email or web address], password [email or web address] to get updated schedule on who will round on this patient, as  hospitalists switch teams weekly. If 7PM-7AM, please contact night-coverage at [email or web address], password [email or web address] for any overnight needs.  10/02/2014, 7:18 AM

## 2014-10-02 NOTE — Progress Notes (Signed)
Inpatient RN visit- [facility] 3 W Room 1323-[facility] RN Visit-Karen Van RN  Related admission to [facility] diagnosis of Prostate Ca.  Pt is FULL CODE.   Pt sent to [facility] ED from MD apt at the [facility] on 11/6 and was admitted for treatment of Hb of 5.7 and possible GI bleed. [facility] was not notified prior to admission. Pt seen at bedside with [place]. Pt lying in bed,alert and oriented, brother Fritz Pickerel present during visit. Pt able to related circumstances that led to his admission. He reports that he had been having dark stools for "a while". Per chart review and discussion with patient he has recvd 2 units of PRBC's and feels "like I could get up and run around". Plan is for patient to have an EGD this afternoon in an attempt to find the source of GI bleeding. He continues on 40mg  IV protonix and scheduled home dose of methadone 15mg  TID for management of pain. Per discussion with [facility] SW Ollen Gross, patient is to return back to his sister Flora's house at discharge. [facility] will continue to follow daily.  [name] home medication list and transfer summary in place on shadow chart.  Please call [facility] @ [phone]- with any hospice needs.   Thank you. Tracey Harries, RN  [facility]  Hospice Liaison  [phone])

## 2014-10-03 ENCOUNTER — Encounter (HOSPITAL_COMMUNITY): Payer: Self-pay | Admitting: Gastroenterology

## 2014-10-03 DIAGNOSIS — B3781 Candidal esophagitis: Secondary | ICD-10-CM

## 2014-10-03 DIAGNOSIS — K2950 Unspecified chronic gastritis without bleeding: Secondary | ICD-10-CM

## 2014-10-03 LAB — CBC
HCT: 25.3 % — ABNORMAL LOW (ref 39.0–52.0)
Hemoglobin: 7.9 g/dL — ABNORMAL LOW (ref 13.0–17.0)
MCH: 29.6 pg (ref 26.0–34.0)
MCHC: 31.2 g/dL (ref 30.0–36.0)
MCV: 94.8 fL (ref 78.0–100.0)
PLATELETS: 69 10*3/uL — AB (ref 150–400)
RBC: 2.67 MIL/uL — ABNORMAL LOW (ref 4.22–5.81)
RDW: 19 % — AB (ref 11.5–15.5)
WBC: 8.7 10*3/uL (ref 4.0–10.5)

## 2014-10-03 LAB — BASIC METABOLIC PANEL
ANION GAP: 13 (ref 5–15)
BUN: 12 mg/dL (ref 6–23)
CALCIUM: 7.5 mg/dL — AB (ref 8.4–10.5)
CO2: 20 mEq/L (ref 19–32)
CREATININE: 0.5 mg/dL (ref 0.50–1.35)
Chloride: 102 mEq/L (ref 96–112)
GFR calc non Af Amer: >90 mL/min (ref >90)
Glucose, Bld: 123 mg/dL — ABNORMAL HIGH (ref 70–99)
Potassium: 4.5 mEq/L (ref 3.7–5.3)
Sodium: 135 mEq/L — ABNORMAL LOW (ref 137–147)

## 2014-10-03 LAB — GLUCOSE, CAPILLARY: Glucose-Capillary: 182 mg/dL — ABNORMAL HIGH (ref 70–99)

## 2014-10-03 MED ORDER — MORPHINE SULFATE 15 MG PO TABS: 15.0000 mg | ORAL_TABLET | ORAL | Status: AC | PRN

## 2014-10-03 MED ORDER — PREDNISONE 5 MG PO TABS: 50.0000 mg | ORAL_TABLET | Freq: Every day | ORAL | Status: AC

## 2014-10-03 MED ORDER — ACETAMINOPHEN 325 MG PO TABS: 650.0000 mg | ORAL_TABLET | Freq: Four times a day (QID) | ORAL | Status: AC | PRN

## 2014-10-03 MED ORDER — PANTOPRAZOLE SODIUM 40 MG PO TBEC: 40.0000 mg | DELAYED_RELEASE_TABLET | Freq: Two times a day (BID) | ORAL | Status: AC

## 2014-10-03 MED ORDER — AZITHROMYCIN 250 MG PO TABS: ORAL_TABLET | ORAL | Status: AC

## 2014-10-03 MED ORDER — ALPRAZOLAM 0.5 MG PO TABS: 0.5000 mg | ORAL_TABLET | Freq: Three times a day (TID) | ORAL | Status: AC

## 2014-10-03 MED ORDER — FLUCONAZOLE 200 MG PO TABS: 200.0000 mg | ORAL_TABLET | Freq: Every day | ORAL | Status: AC

## 2014-10-03 MED ORDER — ONDANSETRON HCL 4 MG PO TABS: 4.0000 mg | ORAL_TABLET | Freq: Four times a day (QID) | ORAL | Status: AC | PRN

## 2014-10-03 NOTE — Progress Notes (Signed)
Inpatient RN visit- [facility] RN Visit-Karen Hackbart RN  Related admission to [facility] diagnosis of Prostate CA.  Pt is FULL CODE.    Pt seen at bedside, alert, oriented, denied pain. [name] brother Gregory [name] present for visit. Staff RN Meredith Mody also present administering patient's scheduled medications. Per chart review and discussion with patient plan is for patient to discharge today. He will continue on diflucan po for 9 more days for treatment of Candidal esophagitis found on his EGD yesterday. No active bleeding or erosions found per operative report.  Patient will discharge via [place] with his brother when discharge is final. No addition equipment needed.  [facility] home care team alerted to discharge.  [name] home medication list and transfer summary in place on shadow chart.  Please call [facility] @ [phone]- with any hospice needs.   Thank you. Tracey Harries, RN  [facility]  Hospice Liaison  [phone])

## 2014-10-03 NOTE — Discharge Summary (Addendum)
Physician Discharge Summary  Gregory Gregory [name] DGU:[id number] DOB: [birth year] DOA: 09/29/2014  PCP: No PCP Per Patient  Admit date: 09/29/2014 Discharge date: 10/03/2014  Recommendations for Outpatient Follow-up:  1. Take fluconazole for 10 days on discharge. 2. Continue Protonix twice daily as prescribed. 3. Taper down prednisone starting from 50 mg a day, taper down by 5 mg a day down to 0 mg. for ex, today 50 mg, tomorrow 45 mg, then 40 mg the following day and etc...  Discharge Diagnoses:  Principal Problem:   GI bleed Active Problems:   Acute blood loss anemia   Prostate cancer metastatic to bone   Thrombocytopenia   Hypokalemia   Elevated alkaline phosphatase level   Other pancytopenia   COPD exacerbation   Melena   Malnutrition of moderate degree   Candidal esophagitis   Antral gastritis    Discharge Condition: stable   Diet recommendation: as tolerated   History of present illness:  68 y.o. Gregory [name] with a PMH of stage IV prostate cancer/bone metastasis status post radiation therapy/androgen operation therapy, as well as chemotherapy with Taxotere, now under hospice care who was admitted on 09/29/14 with a chief complaint of melena. He was admitted from the cancer center after his hemoglobin was found to be 5.4 and platelet count 92. GI has been consulted with plans for EGD 10/02/14.  Assessment/Plan:   Principal Problem:  GI bleed with acute blood loss anemia in the setting of chronic anemia  Hemoglobin 5.4--->7.2 status post 2 units of PRBCs. Hemoglobin 7.3 and 7.9.  Continue  empiric PPI therapy.  Active Problems:   Candida esophagitis / gastritis  Patient underwent endoscopy 10/02/2014 with findings of Candida esophagitis.  Recommendation is for fluconazole for 10 days and Protonix twice daily  Prescription provided for those medications.  COPD exacerbation  Continue Duonebs.  Taper down steroids, starting 50 mg daily down to 0 mg, taper down by 5 mg  a day.   Pancytopenia / thrombocytopenia  Likely reflective from bone marrow involvement of prostate cancer. Blood counts stable.   Moderate protein calorie malnutrition  Pt meets criteria for moderate MALNUTRITION in the context of chronic illness as evidenced by moderate muscle and subcutaneous fat depletion and 27% wt loss x 7 months.Seen by dietician 10/01/14.   Evaluated by dietician 10/01/14. Continue supplements.   Hypokalemia  Corrected with potassium added to IV fluids.   Prostate cancer metastatic to bone / Elevated alkaline phosphatase  Marked elevation of alkaline phosphatase consistent with bony metastasis.  Pain control with methadone and MSIR.  Patient under hospice care after progression of disease status post Taxotere chemotherapy.  Patient will be sent home with prednisone taper. After prednisone taper for COPD he may continue Decadron per home regimen.  CBG's under relatively good control while on prednisone. 132, 125, 182. Blood sugars can be checked by hospice RN and pt may get insulin as need if CBG's high consistently    DVT Prophylaxis  Continue SCDs while in hospital   Code Status: Full. Family Communication: No family at the bedside. Updated the family over the phone, agree with discharge plan.     IV Access:    Peripheral IV   Procedures and diagnostic studies:   Dg Chest Portable 1 View 09/29/2014: 1. No acute cardiopulmonary process. 2. Diffuse sclerotic osseous metastasis.    Medical Consultants:    Dr. Zenovia Jarred, GI.  Anti-Infectives:    Azithromycin 09/30/14---> until 10/04/2014    Signed:  Leisa Lenz, MD  [facility]  10/03/2014, 11:09 AM  Pager #: [phone]   Discharge Exam: Filed Vitals:   10/03/14 0530  BP: 126/69  Pulse: 64  Temp: 98.8 F (37.1 C)  Resp: 16   Filed Vitals:   10/02/14 1600 10/02/14 1937 10/02/14 2105 10/03/14 0530  BP: 131/65  114/61 126/69   Pulse: 71  76 64  Temp:   99.1 F (37.3 C) 98.8 F (37.1 C)  TempSrc:   Oral Oral  Resp: 14  16 16   Height:      Weight:    64.365 kg (141 lb 14.4 oz)  SpO2: 100% 98% 96% 100%    General: Pt is alert, follows commands appropriately, not in acute distress Cardiovascular: Regular rate and rhythm, S1/S2 + Respiratory: mild wheezing in mid and upper lung lobes, no crackles  Abdominal: Soft, non tender, non distended, bowel sounds +, no guarding Extremities: no edema, no cyanosis, pulses palpable bilaterally DP and PT Neuro: Grossly nonfocal  Discharge Instructions  Discharge Instructions    Call MD for:  difficulty breathing, headache or visual disturbances    Complete by:  As directed      Call MD for:  persistant dizziness or light-headedness    Complete by:  As directed      Call MD for:  persistant nausea and vomiting    Complete by:  As directed      Call MD for:  severe uncontrolled pain    Complete by:  As directed      Diet - low sodium heart healthy    Complete by:  As directed      Discharge instructions    Complete by:  As directed   1. Take fluconazole for 10 days on discharge. 2. Continue Protonix twice daily as prescribed. 3. Taper down prednisone starting from 50 mg a day, taper down by 5 mg a day down to 0 mg. for ex, today 50 mg, tomorrow 45 mg, then 40 mg the following day and etc...     Increase activity slowly    Complete by:  As directed             Medication List    TAKE these medications        acetaminophen 325 MG tablet  Commonly known as:  TYLENOL  Take 2 tablets (650 mg total) by mouth every 6 (six) hours as needed for mild pain (or Fever >/= 101).     ALPRAZolam 0.5 MG tablet  Commonly known as:  XANAX  Take 1 tablet (0.5 mg total) by mouth 3 (three) times daily.     azithromycin 250 MG tablet  Commonly known as:  ZITHROMAX  Take for 1 more day.     cholecalciferol 1000 UNITS tablet  Commonly known as:  VITAMIN D  Take 1,000 Units  by mouth daily.     dexamethasone 4 MG tablet  Commonly known as:  DECADRON  Take 4 mg by mouth 2 (two) times daily with a meal.     fluconazole 200 MG tablet  Commonly known as:  DIFLUCAN  Take 1 tablet (200 mg total) by mouth daily.     ipratropium-albuterol 0.5-2.5 (3) MG/3ML Soln  Commonly known as:  DUONEB  Take 3 mLs by nebulization every 6 (six) hours.     methadone 10 MG tablet  Commonly known as:  DOLOPHINE  Take 15 mg by mouth every 8 (eight) hours.     morphine 15 MG tablet  Commonly known as:  MSIR  Take 1 tablet (15 mg total) by mouth every 3 (three) hours as needed for severe pain.     ondansetron 4 MG tablet  Commonly known as:  ZOFRAN  Take 1 tablet (4 mg total) by mouth every 6 (six) hours as needed for nausea.     pantoprazole 40 MG tablet  Commonly known as:  PROTONIX  Take 1 tablet (40 mg total) by mouth 2 (two) times daily.     predniSONE 5 MG tablet  Commonly known as:  DELTASONE  Take 10 tablets (50 mg total) by mouth daily with breakfast.     senna-docusate 8.6-50 MG per tablet  Commonly known as:  Senokot-S  Take 1 tablet by mouth 2 (two) times daily.            Follow-up Information    Follow up with No PCP Per Patient.   Specialty:  General Practice       The results of significant diagnostics from this hospitalization (including imaging, microbiology, ancillary and laboratory) are listed below for reference.    Significant Diagnostic Studies: Dg Chest Portable 1 View  09/29/2014   CLINICAL DATA:  Wheezing, prostate cancer.  EXAM: PORTABLE CHEST - 1 VIEW  COMPARISON:  Radiograph 08/05/2014  FINDINGS: There is a port in the left chest wall unchanged. Normal cardiac silhouette. Snow pulmonary edema or pleural fluid. No pneumothorax. Widespread densely sclerotic skeletal metastasis are noted.  IMPRESSION: 1.  No acute cardiopulmonary process. 2. Diffuse sclerotic osseous metastasis.   Electronically Signed   By: Suzy Bouchard M.D.   On:  09/29/2014 18:11    Microbiology: Recent Results (from the past 240 hour(s))  TECHNOLOGIST REVIEW     Status: None   Collection Time: 09/29/14  3:38 PM  Result Value Ref Range Status   Technologist Review Metas and Myelocytes present, rouleaux present  Final     Labs: Basic Metabolic Panel:  Recent Labs Lab 09/29/14 1900 09/30/14 0520 10/01/14 0410 10/02/14 0412 10/03/14 0346  NA 143 138 139 137 135*  K 3.8 3.3* 4.0 4.4 4.5  CL 104 102 103 103 102  CO2 23 23 23 21 20   GLUCOSE 113* 83 129* 128* 123*  BUN 28* 22 13 10 12   CREATININE 0.72 0.61 0.49* 0.50 0.50  CALCIUM 7.9* 7.4* 7.0* 7.4* 7.5*  MG 2.9*  --   --   --   --   PHOS 2.5  --   --   --   --    Liver Function Tests:  Recent Labs Lab 09/29/14 1538 09/29/14 1900 09/30/14 0520  AST 14 13 13   ALT 10 7 6   ALKPHOS 872* 832* 760*  BILITOT <0.20 <0.2* 0.2*  PROT 7.0 7.1 6.3  ALBUMIN 2.4* 2.4* 2.1*   No results for input(s): LIPASE, AMYLASE in the last 168 hours. No results for input(s): AMMONIA in the last 168 hours. CBC:  Recent Labs Lab 09/29/14 1538 09/29/14 1900 09/30/14 0520 10/01/14 0410 10/02/14 0412 10/03/14 0346  WBC 4.9 4.3 3.8* 6.2 7.2 8.7  NEUTROABS 3.3 2.7  --   --   --   --   HGB 5.7* 5.4* 7.2* 7.5* 7.3* 7.9*  HCT 19.0* 17.7* 22.1* 23.2* 23.1* 25.3*  MCV 96.0 97.8 92.9 92.8 95.5 94.8  PLT 120* 92* 77* 71* 65* 69*   Cardiac Enzymes: No results for input(s): CKTOTAL, CKMB, CKMBINDEX, TROPONINI in the last 168 hours. BNP: BNP (last 3 results)  Recent Labs  08/05/14 1239  PROBNP 878.3*  CBG:  Recent Labs Lab 09/30/14 0743 10/01/14 0733 10/02/14 0750 10/03/14 0754  GLUCAP 84 132* 125* 182*    Time coordinating discharge: Over 30 minutes

## 2014-10-03 NOTE — Discharge Instructions (Signed)
Esophagitis Esophagitis is inflammation of the esophagus. It can involve swelling, soreness, and pain in the esophagus. This condition can make it difficult and painful to swallow. CAUSES  Most causes of esophagitis are not serious. Many different factors can cause esophagitis, including:  Gastroesophageal reflux disease (GERD). This is when acid from your stomach flows up into the esophagus.  Recurrent vomiting.  An allergic-type reaction.  Certain medicines, especially those that come in large pills.  Ingestion of harmful chemicals, such as household cleaning products.  Heavy alcohol use.  An infection of the esophagus.  Radiation treatment for cancer.  Certain diseases such as sarcoidosis, Crohn's disease, and scleroderma. These diseases may cause recurrent esophagitis. SYMPTOMS   Trouble swallowing.  Painful swallowing.  Chest pain.  Difficulty breathing.  Nausea.  Vomiting.  Abdominal pain. DIAGNOSIS  Your caregiver will take your history and do a physical exam. Depending upon what your caregiver finds, certain tests may also be done, including:  Barium X-ray. You will drink a solution that coats the esophagus, and X-rays will be taken.  Endoscopy. A lighted tube is put down the esophagus so your caregiver can examine the area.  Allergy tests. These can sometimes be arranged through follow-up visits. TREATMENT  Treatment will depend on the cause of your esophagitis. In some cases, steroids or other medicines may be given to help relieve your symptoms or to treat the underlying cause of your condition. Medicines that may be recommended include:  Viscous lidocaine, to soothe the esophagus.  Antacids.  Acid reducers.  Proton pump inhibitors.  Antiviral medicines for certain viral infections of the esophagus.  Antifungal medicines for certain fungal infections of the esophagus.  Antibiotic medicines, depending on the cause of the esophagitis. HOME CARE  INSTRUCTIONS   Avoid foods and drinks that seem to make your symptoms worse.  Eat small, frequent meals instead of large meals.  Avoid eating for the 3 hours prior to your bedtime.  If you have trouble taking pills, use a pill splitter to decrease the size and likelihood of the pill getting stuck or injuring the esophagus on the way down. Drinking water after taking a pill also helps.  Stop smoking if you smoke.  Maintain a healthy weight.  Wear loose-fitting clothing. Do not wear anything tight around your waist that causes pressure on your stomach.  Raise the head of your bed 6 to 8 inches with wood blocks to help you sleep. Extra pillows will not help.  Only take over-the-counter or prescription medicines as directed by your caregiver. SEEK IMMEDIATE MEDICAL CARE IF:  You have severe chest pain that radiates into your arm, neck, or jaw.  You feel sweaty, dizzy, or lightheaded.  You have shortness of breath.  You vomit blood.  You have difficulty or pain with swallowing.  You have bloody or black, tarry stools.  You have a fever.  You have a burning sensation in the chest more than 3 times a week for more than 2 weeks.  You cannot swallow, drink, or eat.  You drool because you cannot swallow your saliva. MAKE SURE YOU:  Understand these instructions.  Will watch your condition.  Will get help right away if you are not doing well or get worse. Document Released: 12/18/2004 Document Revised: 02/02/2012 Document Reviewed: 07/11/2011 ExitCare Patient Information 2015 [employer]. This information is not intended to replace advice given to you by your health care provider. Make sure you discuss any questions you have with your health care provider.  

## 2014-10-03 NOTE — Care Management Note (Signed)
CARE MANAGEMENT NOTE 10/03/2014  Patient:  Gregory [name],Gregory [name]   Account Number:  [id number]  Date Initiated:  10/03/2014  Documentation initiated by:  Marney Doctor  Subjective/Objective Assessment:   68 yo admitted with GI bleed. PMH of stage IV prostate cancer/bone metastasis     Action/Plan:   From home with [facility]   Anticipated DC Date:  10/03/2014   Anticipated DC Plan:  [place]  CM consult      Choice offered to / List presented to:             [place]   Status of service:  Completed, signed off Medicare Important Message given?   (If response is "NO", the following Medicare IM given date fields will be blank) Date Medicare IM given:   Medicare IM given by:   Date Additional Medicare IM given:   Additional Medicare IM given by:    Discharge Disposition:    Per UR Regulation:  Reviewed for med. necessity/level of care/duration of stay  If discussed at [place] of Stay Meetings, dates discussed:    Comments:  10/03/14 Marney Doctor RN,BSN,NCM Pt to Dc back home today with [facility].

## 2014-10-03 NOTE — Plan of Care (Signed)
Problem: Phase I Progression Outcomes Goal: OOB as tolerated unless otherwise ordered Outcome: Completed/Met Date Met:  10/03/14 Goal: Initial discharge plan identified Outcome: Completed/Met Date Met:  10/03/14 Goal: Voiding-avoid urinary catheter unless indicated Outcome: Not Progressing Chronic foley Goal: Hemodynamically stable Outcome: Completed/Met Date Met:  10/03/14 Goal: Other Phase I Outcomes/Goals Outcome: Not Applicable Date Met:  59/29/24

## 2014-10-03 NOTE — Progress Notes (Signed)
Pt has been discharged to home with family. Reminded pt's brother that he needed an oxygen tank to go home with since he has been on cont oxygen here and at home. Will call the pt's sister to bring the tank here. Discharged instructions given to patient and pt's brother with verbal understanding.

## 2014-11-24 DEATH — deceased

## 2014-12-07 ENCOUNTER — Encounter (HOSPITAL_COMMUNITY): Payer: Self-pay | Admitting: Gastroenterology

## 2016-03-05 IMAGING — CR DG CHEST 1V PORT
2 series · 2 of 2 positions shown · non-contrast
Comparison: None

CLINICAL DATA: Shortness of breath.  Trauma.

EXAM:
PORTABLE CHEST - 1 VIEW

[AP (1 of 2)]
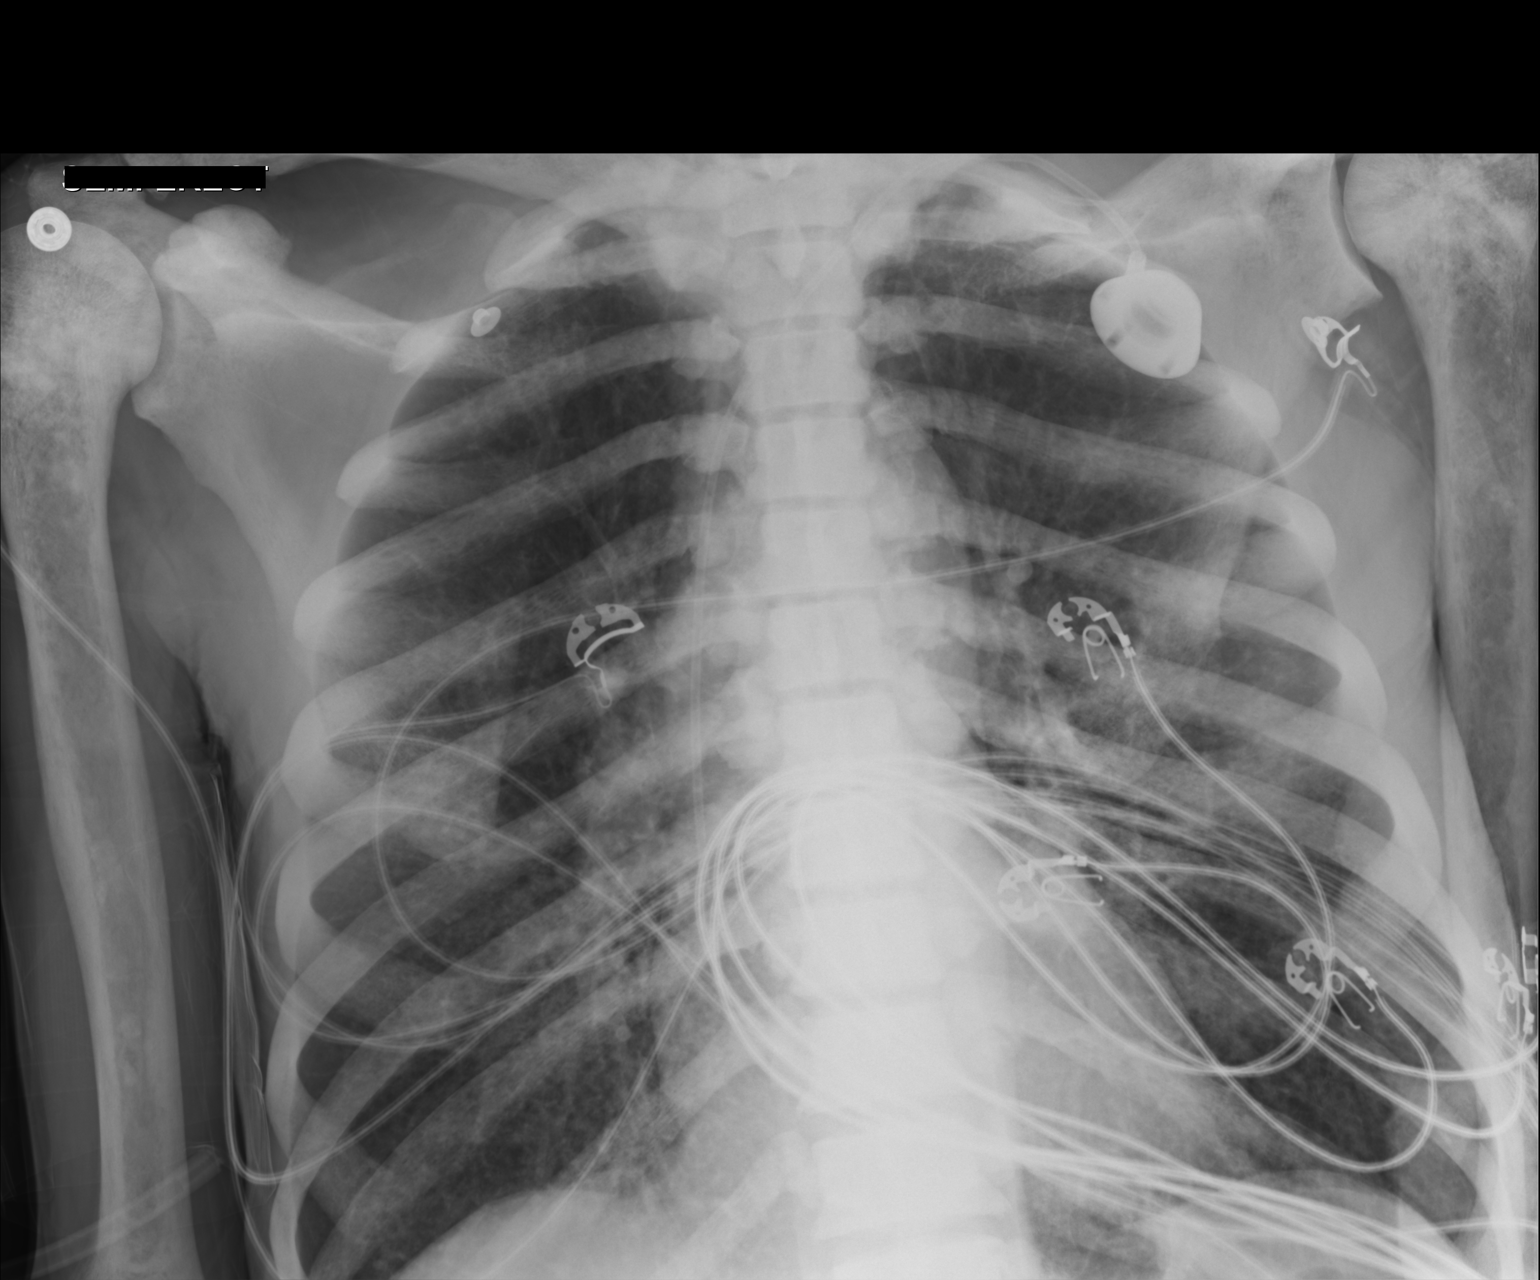

[AP (2 of 2)]
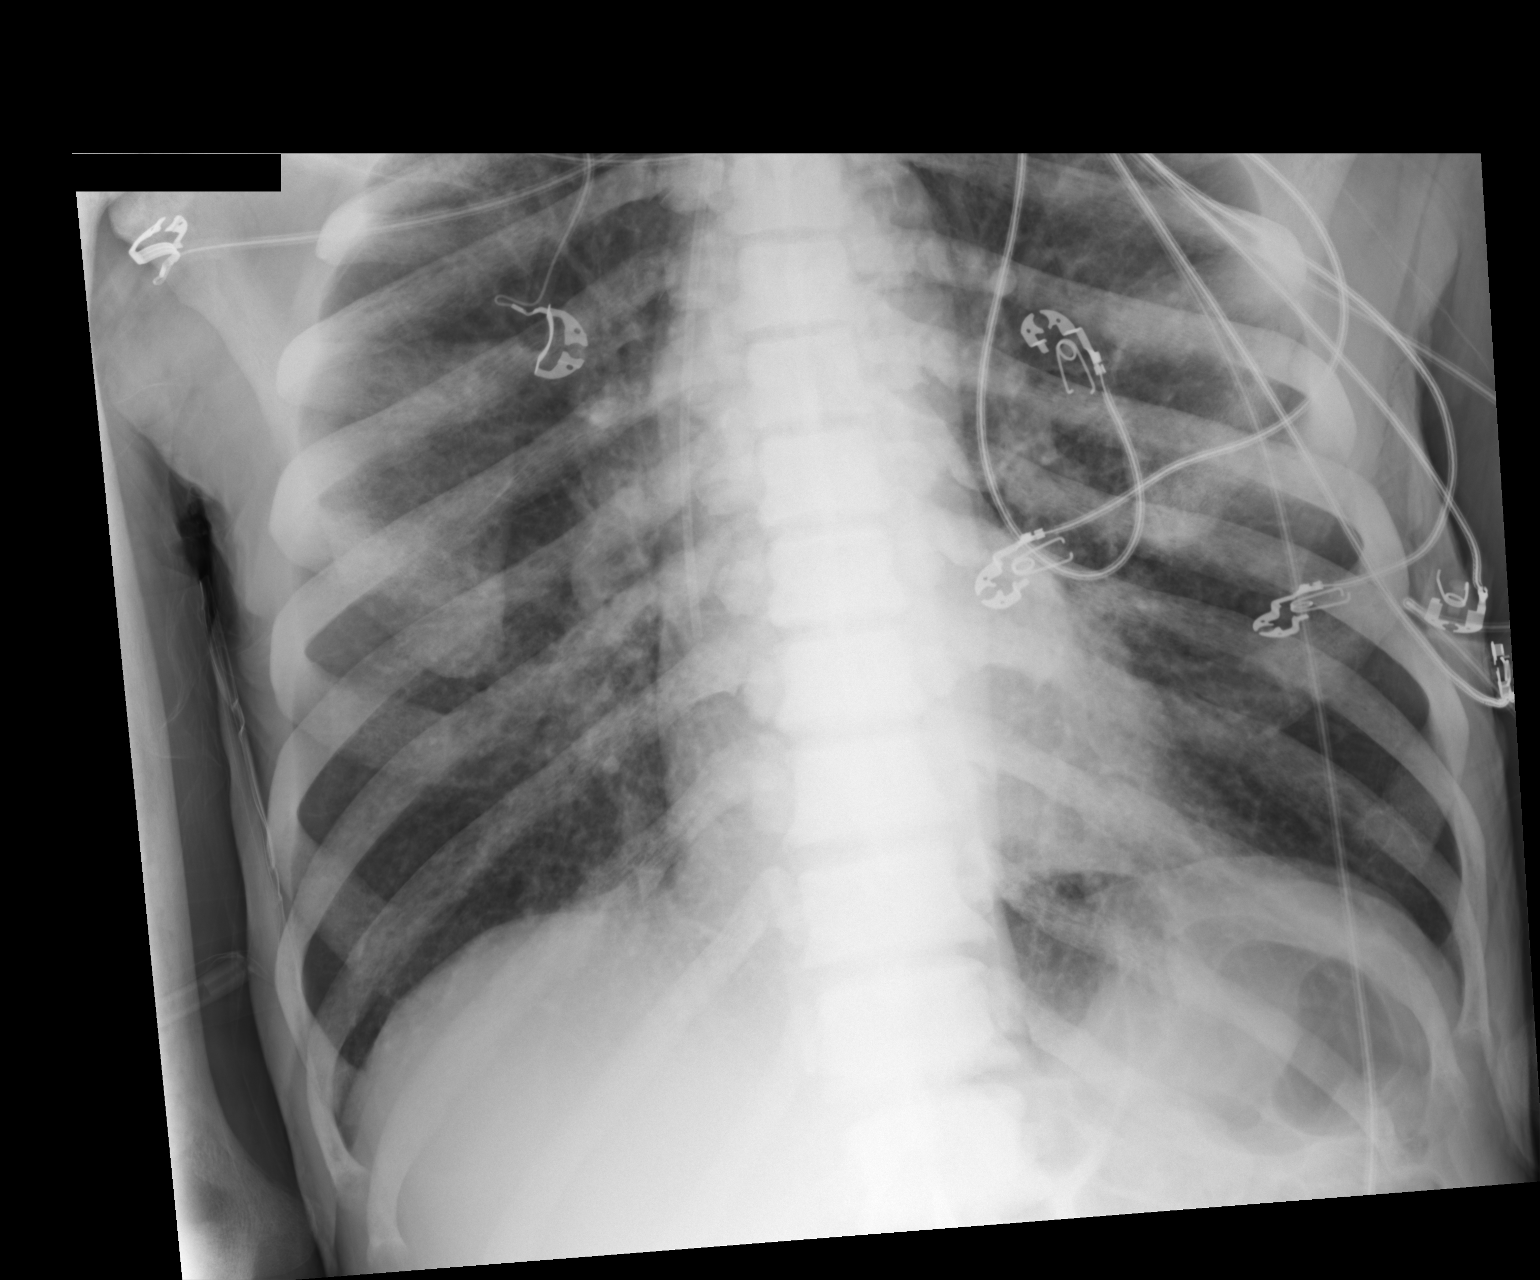

[2 of 2 positions shown; findings below may reference images not displayed]

FINDINGS: The bones are diffusely sclerotic consistent with metastatic
prostate cancer. Port-A-Cath in place. Heart size and vascularity
are normal. The interstitial markings are diffusely slightly
accentuated. No effusions or infiltrates.
IMPRESSION: Diffuse sclerotic metastatic disease. Accentuated interstitial
markings, nonspecific, without consolidative infiltrates. No prior
studies available for comparison.
